# Patient Record
Sex: Female | Born: 1935 | Race: White | Hispanic: No | Smoking: Never smoker
Health system: Southern US, Community
[De-identification: ages and names within clinical notes are randomized; demographics above are authoritative.]

## PROBLEM LIST (undated history)

## (undated) DIAGNOSIS — I10 Essential (primary) hypertension: Secondary | ICD-10-CM

## (undated) DIAGNOSIS — F0390 Unspecified dementia without behavioral disturbance: Secondary | ICD-10-CM

## (undated) HISTORY — PX: ABDOMINAL HYSTERECTOMY: SHX81

## (undated) HISTORY — PX: TONSILLECTOMY: SUR1361

---

## 2019-12-22 ENCOUNTER — Encounter: Payer: Self-pay | Admitting: Emergency Medicine

## 2019-12-22 ENCOUNTER — Other Ambulatory Visit: Payer: Self-pay

## 2019-12-22 ENCOUNTER — Inpatient Hospital Stay
Admission: EM | Admit: 2019-12-22 | Discharge: 2019-12-25 | DRG: 689 | Disposition: A | Discharge status: Hospice | Payer: Medicare Other | Source: Skilled Nursing Facility | Attending: Internal Medicine | Admitting: Internal Medicine

## 2019-12-22 ENCOUNTER — Emergency Department: Payer: Medicare Other

## 2019-12-22 DIAGNOSIS — N179 Acute kidney failure, unspecified: Secondary | ICD-10-CM | POA: Diagnosis not present

## 2019-12-22 DIAGNOSIS — E871 Hypo-osmolality and hyponatremia: Secondary | ICD-10-CM | POA: Diagnosis present

## 2019-12-22 DIAGNOSIS — Z515 Encounter for palliative care: Secondary | ICD-10-CM | POA: Diagnosis not present

## 2019-12-22 DIAGNOSIS — E876 Hypokalemia: Secondary | ICD-10-CM | POA: Diagnosis present

## 2019-12-22 DIAGNOSIS — G9341 Metabolic encephalopathy: Secondary | ICD-10-CM | POA: Diagnosis present

## 2019-12-22 DIAGNOSIS — R778 Other specified abnormalities of plasma proteins: Secondary | ICD-10-CM

## 2019-12-22 DIAGNOSIS — F32A Depression, unspecified: Secondary | ICD-10-CM | POA: Diagnosis present

## 2019-12-22 DIAGNOSIS — F0390 Unspecified dementia without behavioral disturbance: Secondary | ICD-10-CM | POA: Diagnosis present

## 2019-12-22 DIAGNOSIS — B9620 Unspecified Escherichia coli [E. coli] as the cause of diseases classified elsewhere: Secondary | ICD-10-CM | POA: Diagnosis present

## 2019-12-22 DIAGNOSIS — Z20822 Contact with and (suspected) exposure to covid-19: Secondary | ICD-10-CM | POA: Diagnosis present

## 2019-12-22 DIAGNOSIS — N1831 Chronic kidney disease, stage 3a: Secondary | ICD-10-CM

## 2019-12-22 DIAGNOSIS — N3000 Acute cystitis without hematuria: Secondary | ICD-10-CM | POA: Diagnosis not present

## 2019-12-22 DIAGNOSIS — R296 Repeated falls: Secondary | ICD-10-CM | POA: Diagnosis present

## 2019-12-22 DIAGNOSIS — R4182 Altered mental status, unspecified: Secondary | ICD-10-CM

## 2019-12-22 DIAGNOSIS — E039 Hypothyroidism, unspecified: Secondary | ICD-10-CM | POA: Diagnosis present

## 2019-12-22 DIAGNOSIS — Z66 Do not resuscitate: Secondary | ICD-10-CM | POA: Diagnosis present

## 2019-12-22 DIAGNOSIS — F419 Anxiety disorder, unspecified: Secondary | ICD-10-CM | POA: Diagnosis present

## 2019-12-22 DIAGNOSIS — I10 Essential (primary) hypertension: Secondary | ICD-10-CM | POA: Diagnosis present

## 2019-12-22 DIAGNOSIS — N3001 Acute cystitis with hematuria: Principal | ICD-10-CM | POA: Diagnosis present

## 2019-12-22 DIAGNOSIS — R627 Adult failure to thrive: Secondary | ICD-10-CM

## 2019-12-22 DIAGNOSIS — Z7189 Other specified counseling: Secondary | ICD-10-CM

## 2019-12-22 DIAGNOSIS — Z993 Dependence on wheelchair: Secondary | ICD-10-CM

## 2019-12-22 DIAGNOSIS — N390 Urinary tract infection, site not specified: Secondary | ICD-10-CM

## 2019-12-22 DIAGNOSIS — Z90710 Acquired absence of both cervix and uterus: Secondary | ICD-10-CM

## 2019-12-22 DIAGNOSIS — F329 Major depressive disorder, single episode, unspecified: Secondary | ICD-10-CM | POA: Diagnosis present

## 2019-12-22 DIAGNOSIS — I248 Other forms of acute ischemic heart disease: Secondary | ICD-10-CM | POA: Diagnosis present

## 2019-12-22 DIAGNOSIS — I129 Hypertensive chronic kidney disease with stage 1 through stage 4 chronic kidney disease, or unspecified chronic kidney disease: Secondary | ICD-10-CM | POA: Diagnosis present

## 2019-12-22 DIAGNOSIS — E860 Dehydration: Secondary | ICD-10-CM | POA: Diagnosis present

## 2019-12-22 HISTORY — DX: Essential (primary) hypertension: I10

## 2019-12-22 HISTORY — DX: Unspecified dementia, unspecified severity, without behavioral disturbance, psychotic disturbance, mood disturbance, and anxiety: F03.90

## 2019-12-22 LAB — CBC WITH DIFFERENTIAL/PLATELET
Abs Immature Granulocytes: 0.08 10*3/uL — ABNORMAL HIGH (ref 0.00–0.07)
Basophils Absolute: 0.1 10*3/uL (ref 0.0–0.1)
Basophils Relative: 1 %
Eosinophils Absolute: 0 10*3/uL (ref 0.0–0.5)
Eosinophils Relative: 0 %
HCT: 31.6 % — ABNORMAL LOW (ref 36.0–46.0)
Hemoglobin: 11 g/dL — ABNORMAL LOW (ref 12.0–15.0)
Immature Granulocytes: 1 %
Lymphocytes Relative: 12 %
Lymphs Abs: 1.4 10*3/uL (ref 0.7–4.0)
MCH: 31.3 pg (ref 26.0–34.0)
MCHC: 34.8 g/dL (ref 30.0–36.0)
MCV: 89.8 fL (ref 80.0–100.0)
Monocytes Absolute: 1.9 10*3/uL — ABNORMAL HIGH (ref 0.1–1.0)
Monocytes Relative: 17 %
Neutro Abs: 8.2 10*3/uL — ABNORMAL HIGH (ref 1.7–7.7)
Neutrophils Relative %: 69 %
Platelets: 253 10*3/uL (ref 150–400)
RBC: 3.52 MIL/uL — ABNORMAL LOW (ref 3.87–5.11)
RDW: 14.2 % (ref 11.5–15.5)
WBC: 11.7 10*3/uL — ABNORMAL HIGH (ref 4.0–10.5)
nRBC: 0 % (ref 0.0–0.2)

## 2019-12-22 LAB — URINALYSIS, COMPLETE (UACMP) WITH MICROSCOPIC
Bacteria, UA: NONE SEEN
Glucose, UA: NEGATIVE mg/dL
Ketones, ur: 15 mg/dL — AB
Nitrite: NEGATIVE
Protein, ur: >300 mg/dL — AB
RBC / HPF: >50 RBC/hpf (ref 0–5)
Specific Gravity, Urine: 1.02 (ref 1.005–1.030)
Squamous Epithelial / HPF: >50 (ref 0–5)
WBC, UA: >50 WBC/hpf (ref 0–5)
pH: 5 (ref 5.0–8.0)

## 2019-12-22 LAB — COMPREHENSIVE METABOLIC PANEL
ALT: 19 U/L (ref 0–44)
AST: 33 U/L (ref 15–41)
Albumin: 2.6 g/dL — ABNORMAL LOW (ref 3.5–5.0)
Alkaline Phosphatase: 56 U/L (ref 38–126)
Anion gap: 14 (ref 5–15)
BUN: 61 mg/dL — ABNORMAL HIGH (ref 8–23)
CO2: 25 mmol/L (ref 22–32)
Calcium: 9.2 mg/dL (ref 8.9–10.3)
Chloride: 91 mmol/L — ABNORMAL LOW (ref 98–111)
Creatinine, Ser: 1.73 mg/dL — ABNORMAL HIGH (ref 0.44–1.00)
GFR calc Af Amer: 29 mL/min — ABNORMAL LOW (ref >60)
GFR calc non Af Amer: 25 mL/min — ABNORMAL LOW (ref >60)
Glucose, Bld: 139 mg/dL — ABNORMAL HIGH (ref 70–99)
Potassium: 3.3 mmol/L — ABNORMAL LOW (ref 3.5–5.1)
Sodium: 130 mmol/L — ABNORMAL LOW (ref 135–145)
Total Bilirubin: 0.6 mg/dL (ref 0.3–1.2)
Total Protein: 6.7 g/dL (ref 6.5–8.1)

## 2019-12-22 LAB — TROPONIN I (HIGH SENSITIVITY): Troponin I (High Sensitivity): 55 ng/L — ABNORMAL HIGH (ref <18)

## 2019-12-22 LAB — VALPROIC ACID LEVEL: Valproic Acid Lvl: 23 ug/mL — ABNORMAL LOW (ref 50.0–100.0)

## 2019-12-22 MED ORDER — DORZOLAMIDE HCL-TIMOLOL MAL 2-0.5 % OP SOLN
1.0000 [drp] | Freq: Two times a day (BID) | OPHTHALMIC | Status: DC
  Administered 2019-12-22 – 2019-12-24 (×5): 1 [drp] via OPHTHALMIC
  Filled 2019-12-22: qty 10

## 2019-12-22 MED ORDER — SODIUM CHLORIDE 0.9 % IV SOLN: INTRAVENOUS | Status: DC

## 2019-12-22 MED ORDER — FLUOXETINE HCL 20 MG PO CAPS
40.0000 mg | ORAL_CAPSULE | Freq: Every day | ORAL | Status: DC
  Administered 2019-12-23 – 2019-12-24 (×2): 40 mg via ORAL
  Filled 2019-12-22 (×4): qty 2

## 2019-12-22 MED ORDER — MIRTAZAPINE 15 MG PO TABS
15.0000 mg | ORAL_TABLET | Freq: Every day | ORAL | Status: DC
  Administered 2019-12-22 – 2019-12-24 (×3): 15 mg via ORAL
  Filled 2019-12-22 (×3): qty 1

## 2019-12-22 MED ORDER — ONDANSETRON HCL 4 MG/2ML IJ SOLN: 4.0000 mg | Freq: Three times a day (TID) | INTRAMUSCULAR | Status: DC | PRN

## 2019-12-22 MED ORDER — ADULT MULTIVITAMIN W/MINERALS CH
1.0000 | ORAL_TABLET | Freq: Every day | ORAL | Status: DC
  Administered 2019-12-22 – 2019-12-24 (×3): 1 via ORAL
  Filled 2019-12-22 (×3): qty 1

## 2019-12-22 MED ORDER — VITAMIN D3 25 MCG (1000 UNIT) PO TABS
1000.0000 [IU] | ORAL_TABLET | Freq: Every day | ORAL | Status: DC
  Administered 2019-12-22 – 2019-12-24 (×3): 1000 [IU] via ORAL
  Filled 2019-12-22 (×7): qty 1

## 2019-12-22 MED ORDER — HYDRALAZINE HCL 20 MG/ML IJ SOLN
5.0000 mg | INTRAMUSCULAR | Status: DC | PRN
  Administered 2019-12-25: 5 mg via INTRAVENOUS
  Filled 2019-12-22: qty 1

## 2019-12-22 MED ORDER — MEMANTINE HCL 5 MG PO TABS
10.0000 mg | ORAL_TABLET | Freq: Two times a day (BID) | ORAL | Status: DC
  Administered 2019-12-22 – 2019-12-24 (×5): 10 mg via ORAL
  Filled 2019-12-22 (×5): qty 2

## 2019-12-22 MED ORDER — LEVOTHYROXINE SODIUM 50 MCG PO TABS
75.0000 ug | ORAL_TABLET | ORAL | Status: DC
  Administered 2019-12-24 – 2019-12-25 (×2): 75 ug via ORAL
  Filled 2019-12-22 (×2): qty 1

## 2019-12-22 MED ORDER — SODIUM CHLORIDE 0.9 % IV SOLN
1.0000 g | INTRAVENOUS | Status: DC
  Administered 2019-12-23 – 2019-12-25 (×3): 1 g via INTRAVENOUS
  Filled 2019-12-22 (×2): qty 1
  Filled 2019-12-22: qty 10

## 2019-12-22 MED ORDER — DIVALPROEX SODIUM 125 MG PO DR TAB
125.0000 mg | DELAYED_RELEASE_TABLET | Freq: Two times a day (BID) | ORAL | Status: DC
  Administered 2019-12-22 – 2019-12-24 (×5): 125 mg via ORAL
  Filled 2019-12-22 (×7): qty 1

## 2019-12-22 MED ORDER — BACITRACIN-NEOMYCIN-POLYMYXIN 400-5-5000 EX OINT
1.0000 "application " | TOPICAL_OINTMENT | Freq: Every day | CUTANEOUS | Status: DC
  Administered 2019-12-23 – 2019-12-24 (×2): 1 via TOPICAL
  Filled 2019-12-22 (×4): qty 1

## 2019-12-22 MED ORDER — CALCIUM CARBONATE ANTACID 500 MG PO CHEW
600.0000 mg | CHEWABLE_TABLET | Freq: Every day | ORAL | Status: DC
  Administered 2019-12-23 – 2019-12-24 (×2): 600 mg via ORAL
  Filled 2019-12-22 (×2): qty 3

## 2019-12-22 MED ORDER — ACETAMINOPHEN 325 MG PO TABS: 650.0000 mg | ORAL_TABLET | Freq: Four times a day (QID) | ORAL | Status: DC | PRN

## 2019-12-22 MED ORDER — LEVOTHYROXINE SODIUM 25 MCG PO TABS
37.5000 ug | ORAL_TABLET | ORAL | Status: DC
  Administered 2019-12-23: 37.5 ug via ORAL
  Filled 2019-12-22: qty 2

## 2019-12-22 MED ORDER — LACTATED RINGERS IV BOLUS
1000.0000 mL | Freq: Once | INTRAVENOUS | Status: AC
  Administered 2019-12-22: 1000 mL via INTRAVENOUS

## 2019-12-22 MED ORDER — LEVOTHYROXINE SODIUM 25 MCG PO TABS: 37.5000 ug | ORAL_TABLET | ORAL | Status: DC

## 2019-12-22 MED ORDER — ENOXAPARIN SODIUM 40 MG/0.4ML ~~LOC~~ SOLN: 40.0000 mg | SUBCUTANEOUS | Status: DC

## 2019-12-22 MED ORDER — POTASSIUM CHLORIDE CRYS ER 20 MEQ PO TBCR
40.0000 meq | EXTENDED_RELEASE_TABLET | Freq: Once | ORAL | Status: AC
  Administered 2019-12-22: 40 meq via ORAL
  Filled 2019-12-22: qty 4

## 2019-12-22 MED ORDER — LATANOPROST 0.005 % OP SOLN
1.0000 [drp] | Freq: Every day | OPHTHALMIC | Status: DC
  Administered 2019-12-22 – 2019-12-24 (×3): 1 [drp] via OPHTHALMIC
  Filled 2019-12-22: qty 2.5

## 2019-12-22 MED ORDER — SODIUM CHLORIDE 0.9 % IV SOLN
1.0000 g | Freq: Once | INTRAVENOUS | Status: AC
  Administered 2019-12-22: 1 g via INTRAVENOUS
  Filled 2019-12-22: qty 10

## 2019-12-22 MED ORDER — AMLODIPINE BESYLATE 5 MG PO TABS
5.0000 mg | ORAL_TABLET | Freq: Every day | ORAL | Status: DC
  Filled 2019-12-22: qty 1

## 2019-12-22 MED ORDER — ENOXAPARIN SODIUM 30 MG/0.3ML ~~LOC~~ SOLN
30.0000 mg | SUBCUTANEOUS | Status: DC
  Administered 2019-12-22 – 2019-12-23 (×2): 30 mg via SUBCUTANEOUS
  Filled 2019-12-22 (×3): qty 0.3

## 2019-12-22 NOTE — ED Triage Notes (Addendum)
Pt via EMS from [facility] [place]. Facility states pt has been more altered that normal. Pt does have a hx of dementia but pt is usually alert and ambulatory. Facility also stated they noticed a foul-smelling order. On arrival, pt is alert but only stating the words "why". Unable to tell me name, place, time, or situation.

## 2019-12-22 NOTE — ED Provider Notes (Signed)
[facility] Emergency Department Provider Note   ____________________________________________   First MD Initiated Contact with Patient 12/22/19 [phone]     (approximate)  I have reviewed the triage vital signs and the nursing notes.   HISTORY  Chief Complaint Altered Mental Status   HPI Tina Campbell is a 86 y.o. female with past medical history of dementia and hypertension who presents to the ED for altered mental status.  History is limited due to patient's current confusion.  Per EMS, she resides at a nursing facility where she has been increasingly confused with poor p.o. intake over the past couple of days.  She has reportedly not wanted to eat or drink anything and staff felt like her urine seems to have a stronger odor recently.  She has not had any fevers has not complained of any pain.  During transport with EMS, patient would only say "why" repeatedly.  At her baseline, patient is reportedly ambulatory and interactive.        Past Medical History:  Diagnosis Date  . Dementia ([place])   . Hypertension     Patient Active Problem List   Diagnosis Date Noted  . Hypertension   . UTI (urinary tract infection)   . Acute metabolic encephalopathy   . Hypokalemia     History reviewed. No pertinent surgical history.  Prior to Admission medications   Not on File    Allergies Aricept [donepezil], Bee pollen, Chocolate, Fire ant, Peanut butter flavor, and Penicillins  History reviewed. No pertinent family history.  Social History Social History   Tobacco Use  . Smoking status: Not on file  Substance Use Topics  . Alcohol use: Not on file  . Drug use: Not on file    Review of Systems Unable to obtain secondary to dementia and altered mental status.  ____________________________________________   PHYSICAL EXAM:  VITAL SIGNS: ED Triage Vitals  Enc Vitals Group     BP      Pulse      Resp      Temp      Temp src      SpO2       Weight      Height      Head Circumference      Peak Flow      Pain Score      Pain Loc      Pain Edu?      Excl. in [place]?     Constitutional: Awake and alert, states only "why". Eyes: Conjunctivae are normal.  Pupils equal round and reactive to light bilaterally. Head: Atraumatic. Nose: No congestion/rhinnorhea. Mouth/Throat: Mucous membranes are dry. Neck: Normal ROM Cardiovascular: Normal rate, regular rhythm. Grossly normal heart sounds. Respiratory: Normal respiratory effort.  No retractions. Lungs CTAB. Gastrointestinal: Soft and nontender. No distention. Genitourinary: deferred Musculoskeletal: No lower extremity tenderness nor edema. Neurologic:  Normal speech and language. No gross focal neurologic deficits are appreciated. Skin:  Skin is warm, dry and intact. No rash noted. Psychiatric: Unable to assess.  ____________________________________________   LABS (all labs ordered are listed, but only abnormal results are displayed)  Labs Reviewed  CBC WITH DIFFERENTIAL/PLATELET - Abnormal; Notable for the following components:      Result Value   WBC 11.7 (*)    RBC 3.52 (*)    Hemoglobin 11.0 (*)    HCT 31.6 (*)    Neutro Abs 8.2 (*)    Monocytes Absolute 1.9 (*)    Abs Immature Granulocytes  0.08 (*)    All other components within normal limits  COMPREHENSIVE METABOLIC PANEL - Abnormal; Notable for the following components:   Sodium 130 (*)    Potassium 3.3 (*)    Chloride 91 (*)    Glucose, Bld 139 (*)    BUN 61 (*)    Creatinine, Ser 1.73 (*)    Albumin 2.6 (*)    GFR calc non Af Amer 25 (*)    GFR calc Af Amer 29 (*)    All other components within normal limits  URINALYSIS, COMPLETE (UACMP) WITH MICROSCOPIC - Abnormal; Notable for the following components:   APPearance TURBID (*)    Hgb urine dipstick LARGE (*)    Bilirubin Urine MODERATE (*)    Ketones, ur 15 (*)    Protein, ur >300 (*)    Leukocytes,Ua LARGE (*)    All other components within normal  limits  VALPROIC ACID LEVEL - Abnormal; Notable for the following components:   Valproic Acid Lvl 23 (*)    All other components within normal limits  URINE CULTURE  SARS CORONAVIRUS 2 (TAT 6-24 HRS)  CULTURE, BLOOD (ROUTINE X 2)  CULTURE, BLOOD (ROUTINE X 2)   ____________________________________________  EKG  ED ECG REPORT I, Chesley Noon, the attending physician, personally viewed and interpreted this ECG.   Date: 12/22/2019  EKG Time: 9:26  Rate: 68  Rhythm: normal sinus rhythm  Axis: RAD  Intervals:none  ST&T Change: None   PROCEDURES  Procedure(s) performed (including Critical Care):  Procedures   ____________________________________________   INITIAL IMPRESSION / ASSESSMENT AND PLAN / ED COURSE       86 year old female with history of dementia and hypertension presents to the ED from her nursing facility for increased confusion as well as poor p.o. intake over the past couple of days, also reported increased odor of urine.  She is awake and alert on arrival with no apparent focal neurologic deficits, but repeatedly states "why" and is unable to verbalize any other complaints.  She does not appear to have any tenderness on abdominal exam.  We will further assess with CT head, labs, UA.  She does appear dehydrated and we will hydrate with IV fluids.  Also check Depakote level as she takes this regularly per nursing home paperwork.  CT head is negative for acute process.  Lab work is consistent with an AKI and UA also concerning for UTI, urine sample obtained by nursing is grossly purulent.  Depakote levels within normal limits and unlikely to be contributing to her altered mental status.  We will send urine for culture and start dose of Rocephin as she seems to have tolerated cephalosporins in the past.  Case was discussed with hospitalist for admission.      ____________________________________________   FINAL CLINICAL IMPRESSION(S) / ED DIAGNOSES  Final  diagnoses:  Altered mental status, unspecified altered mental status type  Urinary tract infection without hematuria, site unspecified     ED Discharge Orders    None       Note:  This document was prepared using Dragon voice recognition software and may include unintentional dictation errors.   Chesley Noon, MD 12/22/19 1209

## 2019-12-22 NOTE — Progress Notes (Signed)
Anticoagulation monitoring(Lovenox):  86yo  female ordered Lovenox 40 mg Q24h for DVT prevention  Filed Weights   12/22/19 0924  Weight: 58.9 kg (129 lb 14.4 oz)   BMI 22   Lab Results  Component Value Date   CREATININE 1.73 (H) 12/22/2019   Estimated Creatinine Clearance: 17.2 mL/min (A) (by C-G formula based on SCr of 1.73 mg/dL (H)). Hemoglobin & Hematocrit     Component Value Date/Time   HGB 11.0 (L) 12/22/2019 0927   HCT 31.6 (L) 12/22/2019 5300     Per Protocol for Patient with estCrcl < 30 ml/min and BMI < 40, will transition to Lovenox 30 mg Q24h.     Clovia Cuff, PharmD, BCPS 12/22/2019 12:32 PM

## 2019-12-22 NOTE — Plan of Care (Signed)
Patient will improve within 30 days

## 2019-12-22 NOTE — H&P (Signed)
History and Physical    Tina Campbell DGU:[id number] DOB: 12-19-1922 DOA: 12/22/2019  Referring MD/NP/PA:   PCP: [facility]   Patient coming from:  The patient is coming from SNF.  At baseline, pt is dependent for most of ADL.        Chief Complaint: Altered mental status  HPI: Tina Campbell is a 86 y.o. female with medical history significant of hypertension, dementia, CKD stage III, depression, hypothyroidism, who presents with altered mental status.  Per her daughter (I called her daughter by phone), patient normally knows the place and sometimes oriented to time, and sometimes can recognize family members, but per report, patient has been more confused in the past several days. Pt stopped talking. She has poor appetite and poor oral intake.  She has strong urine smell.  No active cough, respiratory distress, nausea, vomiting, diarrhea noted.  Patient does not seem to have chest pain or abdominal pain.  She moves all extremities.  ED Course: pt was found to have WBC 11.7, pending COVID-19 PCR, valproic acid level 23, urinalysis (turbid appearance, large amount of leukocyte, no bacteria, WBC > 50, squamous cell 50), potassium 3.3, worsening renal function, temperature normal, blood pressure 115/49, heart rate 64, RR 20, oxygen saturation 92% on room air.  CT head is negative for acute intracranial abnormalities.  Patient is placed on MedSurg bed for observation.  Review of Systems: Could not be reviewed due to dementia and altered mental status.    Allergy:  Allergies  Allergen Reactions  . Aricept [Donepezil]   . Bee Pollen   . Chocolate   . Fire [employer]   . Peanut Butter Flavor   . Penicillins     Past Medical History:  Diagnosis Date  . Dementia ([facility])   . Hypertension     Past Surgical History:  Procedure Laterality Date  . ABDOMINAL HYSTERECTOMY    . TONSILLECTOMY      Social History:  reports that she has never smoked. She has never used  smokeless tobacco. She reports previous alcohol use. She reports that she does not use drugs.  Family History: could not be reviewed due to altered mental status and dementia  Prior to Admission medications   Not on File    Physical Exam: Vitals:   12/22/19 1059 12/22/19 1100 12/22/19 1130 12/22/19 1200  BP: (!) 106/50 (!) 115/49 (!) 130/47 (!) 117/50  Pulse: 63 64 70 68  Resp: 13 13 13 13   Temp:      TempSrc:      SpO2: 99% 99% 91% 98%  Weight:      Height:       General: Not in acute distress HEENT:       Eyes: PERRL, EOMI, no scleral icterus.       ENT: No discharge from the ears and nose.       Neck: No JVD, no bruit, no mass felt. Heme: No neck lymph node enlargement. Cardiac: S1/S2, RRR, No murmurs, No gallops or rubs. Respiratory: No rales, wheezing, rhonchi or rubs. GI: Soft, nondistended, nontender, no organomegaly, BS present. GU: No hematuria Ext: No pitting leg edema bilaterally. 1+DP/PT pulse bilaterally. Musculoskeletal: No joint deformities, No joint redness or warmth, no limitation of ROM in spin. Skin: No rashes.  Neuro: pt is not answering questions, not following command, not oriented X3, cranial nerves II-XII grossly intact, moves all extremities.  Psych: Patient is not psychotic, no suicidal or hemocidal ideation.  Labs on Admission: I have personally  reviewed following labs and imaging studies  CBC: Recent Labs  Lab 12/22/19 0927  WBC 11.7*  NEUTROABS 8.2*  HGB 11.0*  HCT 31.6*  MCV 89.8  PLT 161   Basic Metabolic Panel: Recent Labs  Lab 12/22/19 0927  NA 130*  K 3.3*  CL 91*  CO2 25  GLUCOSE 139*  BUN 61*  CREATININE 1.73*  CALCIUM 9.2   GFR: Estimated Creatinine Clearance: 17.2 mL/min (A) (by C-G formula based on SCr of 1.73 mg/dL (H)). Liver Function Tests: Recent Labs  Lab 12/22/19 0927  AST 33  ALT 19  ALKPHOS 56  BILITOT 0.6  PROT 6.7  ALBUMIN 2.6*   No results for input(s): LIPASE, AMYLASE in the last 168  hours. No results for input(s): AMMONIA in the last 168 hours. Coagulation Profile: No results for input(s): INR, PROTIME in the last 168 hours. Cardiac Enzymes: No results for input(s): CKTOTAL, CKMB, CKMBINDEX, TROPONINI in the last 168 hours. BNP (last 3 results) No results for input(s): PROBNP in the last 8760 hours. HbA1C: No results for input(s): HGBA1C in the last 72 hours. CBG: No results for input(s): GLUCAP in the last 168 hours. Lipid Profile: No results for input(s): CHOL, HDL, LDLCALC, TRIG, CHOLHDL, LDLDIRECT in the last 72 hours. Thyroid Function Tests: No results for input(s): TSH, T4TOTAL, FREET4, T3FREE, THYROIDAB in the last 72 hours. Anemia Panel: No results for input(s): VITAMINB12, FOLATE, FERRITIN, TIBC, IRON, RETICCTPCT in the last 72 hours. Urine analysis:    Component Value Date/Time   COLORURINE YELLOW 12/22/2019 1108   APPEARANCEUR TURBID (A) 12/22/2019 1108   LABSPEC 1.020 12/22/2019 1108   PHURINE 5.0 12/22/2019 1108   GLUCOSEU NEGATIVE 12/22/2019 1108   HGBUR LARGE (A) 12/22/2019 1108   BILIRUBINUR MODERATE (A) 12/22/2019 1108   KETONESUR 15 (A) 12/22/2019 1108   PROTEINUR >300 (A) 12/22/2019 1108   NITRITE NEGATIVE 12/22/2019 1108   LEUKOCYTESUR LARGE (A) 12/22/2019 1108   Sepsis Labs: @LABRCNTIP (procalcitonin:4,lacticidven:4) )No results found for this or any previous visit (from the past 240 hour(s)).   Radiological Exams on Admission: CT Head Wo Contrast  Result Date: 12/22/2019 CLINICAL DATA:  Altered mental status. EXAM: CT HEAD WITHOUT CONTRAST TECHNIQUE: Contiguous axial images were obtained from the base of the skull through the vertex without intravenous contrast. COMPARISON:  None. FINDINGS: Brain: No acute intracranial hemorrhage. No focal mass lesion. No CT evidence of acute infarction. No midline shift or mass effect. No hydrocephalus. Basilar cisterns are patent. There are periventricular and subcortical white matter hypodensities.  Generalized cortical atrophy. Vascular: No hyperdense vessel or unexpected calcification. Skull: Normal. Negative for fracture or focal lesion. Sinuses/Orbits: Paranasal sinuses and mastoid air cells are clear. Orbits are clear. Other: None. IMPRESSION: 1. No acute intracranial findings. 2. Atrophy and white matter microvascular disease. Electronically Signed   By: Suzy Bouchard M.D.   On: 12/22/2019 11:03     EKG: Independently reviewed.  Sinus rhythm, QTC 445, T wave inversion in lead III/aV F, anteroseptal infarction pattern  Assessment/Plan Principal Problem:   UTI (urinary tract infection) Active Problems:   Hypertension   Acute metabolic encephalopathy   Hypokalemia   Acute renal failure superimposed on stage 3a chronic kidney disease (HCC)   Dementia (HCC)   Hypothyroidism   Depression   Possible UTI (urinary tract infection):  urinalysis showed turbid appearance, large amount of leukocyte, no bacteria, WBC > 50, but with squamous cell 50.   -will place on MedSurg bed for position -Rocephin IV -Follow-up blood  culture and urine culture  HTN:  -Continue home medications: Amlodipine -hold Avalide due to worsening renal function -hydralazine prn  Acute metabolic encephalopathy: Likely due to UTI. CT-head negative.  No focal neurological deficit on physical examination -Frequent neuro check  Hypokalemia: K= 3.3 on admission. - Repleted - Check Mg level  Acute renal failure superimposed on stage 3a chronic kidney disease (HCC):  Baseline Cre is 1.0 on 05/23/2019, pt's Cre is 1.73, BUN 61 on admission. Likely due to prerenal secondary to dehydration and continuation of ARB, diuretics - IVF: 1L LR and then 75 cc/h - Follow up renal function by BMP - Avoid using renal toxic medications, hypotension and contrast dye (or carefully use) - Hold Avalide  Dementia (HCC): -Namenda  Hypothyroidism -Synthroid  Depression and anxiety -Depakote, Prozac, Remeron     DVT  ppx: SQ Lovenox Code Status: DNR per her daughter Family Communication:  Yes, patient's daughter by phone Disposition Plan:  Anticipate discharge back to previous SNF environment Consults called:  none Admission status: Med-surg bed for obs      Status is: Observation  The patient remains OBS appropriate and will d/c before 2 midnights.  Dispo: The patient is from: SNF              Anticipated d/c is to: SNF              Anticipated d/c date is: 1 day              Patient currently is not medically stable to d/c.          Date of Service 12/22/2019    Lorretta Harp [facility]   If 7PM-7AM, please contact night-coverage [email or web address] 12/22/2019, 12:55 PM

## 2019-12-23 DIAGNOSIS — Z515 Encounter for palliative care: Secondary | ICD-10-CM | POA: Diagnosis not present

## 2019-12-23 DIAGNOSIS — G9341 Metabolic encephalopathy: Secondary | ICD-10-CM | POA: Diagnosis present

## 2019-12-23 DIAGNOSIS — N3000 Acute cystitis without hematuria: Secondary | ICD-10-CM | POA: Diagnosis not present

## 2019-12-23 DIAGNOSIS — Z66 Do not resuscitate: Secondary | ICD-10-CM | POA: Diagnosis present

## 2019-12-23 DIAGNOSIS — I248 Other forms of acute ischemic heart disease: Secondary | ICD-10-CM | POA: Diagnosis present

## 2019-12-23 DIAGNOSIS — F0390 Unspecified dementia without behavioral disturbance: Secondary | ICD-10-CM | POA: Diagnosis present

## 2019-12-23 DIAGNOSIS — Z993 Dependence on wheelchair: Secondary | ICD-10-CM | POA: Diagnosis not present

## 2019-12-23 DIAGNOSIS — F0391 Unspecified dementia with behavioral disturbance: Secondary | ICD-10-CM

## 2019-12-23 DIAGNOSIS — F419 Anxiety disorder, unspecified: Secondary | ICD-10-CM | POA: Diagnosis present

## 2019-12-23 DIAGNOSIS — Z7189 Other specified counseling: Secondary | ICD-10-CM | POA: Diagnosis not present

## 2019-12-23 DIAGNOSIS — E876 Hypokalemia: Secondary | ICD-10-CM | POA: Diagnosis present

## 2019-12-23 DIAGNOSIS — B9620 Unspecified Escherichia coli [E. coli] as the cause of diseases classified elsewhere: Secondary | ICD-10-CM | POA: Diagnosis present

## 2019-12-23 DIAGNOSIS — E871 Hypo-osmolality and hyponatremia: Secondary | ICD-10-CM | POA: Diagnosis present

## 2019-12-23 DIAGNOSIS — N3001 Acute cystitis with hematuria: Secondary | ICD-10-CM | POA: Diagnosis present

## 2019-12-23 DIAGNOSIS — R4182 Altered mental status, unspecified: Secondary | ICD-10-CM | POA: Diagnosis present

## 2019-12-23 DIAGNOSIS — Z20822 Contact with and (suspected) exposure to covid-19: Secondary | ICD-10-CM | POA: Diagnosis present

## 2019-12-23 DIAGNOSIS — R627 Adult failure to thrive: Secondary | ICD-10-CM | POA: Diagnosis present

## 2019-12-23 DIAGNOSIS — E039 Hypothyroidism, unspecified: Secondary | ICD-10-CM | POA: Diagnosis present

## 2019-12-23 DIAGNOSIS — Z90710 Acquired absence of both cervix and uterus: Secondary | ICD-10-CM | POA: Diagnosis not present

## 2019-12-23 DIAGNOSIS — R296 Repeated falls: Secondary | ICD-10-CM | POA: Diagnosis present

## 2019-12-23 DIAGNOSIS — F329 Major depressive disorder, single episode, unspecified: Secondary | ICD-10-CM | POA: Diagnosis present

## 2019-12-23 DIAGNOSIS — E860 Dehydration: Secondary | ICD-10-CM | POA: Diagnosis present

## 2019-12-23 DIAGNOSIS — N1831 Chronic kidney disease, stage 3a: Secondary | ICD-10-CM | POA: Diagnosis present

## 2019-12-23 DIAGNOSIS — N179 Acute kidney failure, unspecified: Secondary | ICD-10-CM | POA: Diagnosis present

## 2019-12-23 DIAGNOSIS — I129 Hypertensive chronic kidney disease with stage 1 through stage 4 chronic kidney disease, or unspecified chronic kidney disease: Secondary | ICD-10-CM | POA: Diagnosis present

## 2019-12-23 LAB — CBC
HCT: 30.7 % — ABNORMAL LOW (ref 36.0–46.0)
Hemoglobin: 10.9 g/dL — ABNORMAL LOW (ref 12.0–15.0)
MCH: 31.4 pg (ref 26.0–34.0)
MCHC: 35.5 g/dL (ref 30.0–36.0)
MCV: 88.5 fL (ref 80.0–100.0)
Platelets: 261 10*3/uL (ref 150–400)
RBC: 3.47 MIL/uL — ABNORMAL LOW (ref 3.87–5.11)
RDW: 14.1 % (ref 11.5–15.5)
WBC: 13.8 10*3/uL — ABNORMAL HIGH (ref 4.0–10.5)
nRBC: 0 % (ref 0.0–0.2)

## 2019-12-23 LAB — BASIC METABOLIC PANEL
Anion gap: 10 (ref 5–15)
BUN: 44 mg/dL — ABNORMAL HIGH (ref 8–23)
CO2: 24 mmol/L (ref 22–32)
Calcium: 8.7 mg/dL — ABNORMAL LOW (ref 8.9–10.3)
Chloride: 100 mmol/L (ref 98–111)
Creatinine, Ser: 1.2 mg/dL — ABNORMAL HIGH (ref 0.44–1.00)
GFR calc Af Amer: 45 mL/min — ABNORMAL LOW (ref >60)
GFR calc non Af Amer: 39 mL/min — ABNORMAL LOW (ref >60)
Glucose, Bld: 122 mg/dL — ABNORMAL HIGH (ref 70–99)
Potassium: 3.7 mmol/L (ref 3.5–5.1)
Sodium: 134 mmol/L — ABNORMAL LOW (ref 135–145)

## 2019-12-23 LAB — SARS CORONAVIRUS 2 (TAT 6-24 HRS): SARS Coronavirus 2: NEGATIVE

## 2019-12-23 LAB — MAGNESIUM: Magnesium: 1.9 mg/dL (ref 1.7–2.4)

## 2019-12-23 NOTE — Progress Notes (Addendum)
Diastolic bp 45  And patient has a scheduled Norvasc. Messaged Dr. Hanley Ben and he said not to give he would discontinue.

## 2019-12-23 NOTE — Progress Notes (Signed)
Patient ID: Tina Campbell, female   DOB: 1922-09-12, 86 y.o.   MRN: [id number]  PROGRESS NOTE    Tina Campbell  QAS:[id number] DOB: [birth year] DOA: 12/22/2019 PCP: [facility]   Brief Narrative:  86 year old female with history of hypertension, dementia,  chronic renal disease stage III, depression, hypothyroidism, overall worsening general health over the last few weeks presented with altered mental status.  On presentation, UA was suggestive of UTI.  CT head was negative for acute intracranial abnormality.  She was started on IV antibiotics.  Assessment & Plan:   Probable UTI: Present on admission -Continue Rocephin.  Follow cultures.  Continue IV fluids  Acute metabolic encephalopathy History of dementia -Patient's overall status has been declining recently. -Mental status is not improving.  Monitor mental status.  Fall precaution.  Delirium precautions. -CT head was negative for acute abnormality -If condition does not improve in the next 24/48 hours, consider hospice/comfort measures.  Palliative care evaluation for goals of care discussion. -Continue Namenda  Leukocytosis -Probably from UTI.  Monitor  Acute kidney injury on chronic renal disease stage IIIa -Presented with creatinine of 1.73.  Baseline creatinine of 1 on 05/23/2019.  Creatinine 1.20 this morning.  Monitor.  Hypokalemia Replaced.  Improved  Hypertension -Monitor blood pressure.  Antihypertensives on hold  Hypothyroidism -Synthroid  Depression and anxiety -Continue Depakote, Prozac and Remeron    DVT prophylaxis: Lovenox Code Status: Full Family Communication: Son and daughter-in-law at bedside Disposition Plan: Status is: Observation  The patient will require care spanning > 2 midnights and should be moved to inpatient because: Altered mental status  Dispo: The patient is from: Group home              Anticipated d/c is to: SNF              Anticipated d/c date is: > 3  days              Patient currently is not medically stable to d/c.  Consultants: Will consult palliative care  Procedures: None  Antimicrobials: Rocephin from 12/22/2019 onwards   Subjective: Patient seen and examined at bedside.  She is sleepy, wakes up slightly, tells her name but otherwise hardly answers any questions.  Poor historian.  No overnight fever or vomiting reported.  Oral intake is poor.  Son and daughter-in-law at bedside. Objective: Vitals:   12/22/19 1942 12/22/19 2340 12/23/19 0446 12/23/19 0906  BP: (!) 134/46 (!) 133/51 (!) 162/50 (!) 147/45  Pulse: 88 75 73 83  Resp: 16 16 16 16   Temp: 98.4 F (36.9 C) 98.5 F (36.9 C) 98.6 F (37 C) 99.4 F (37.4 C)  TempSrc: Oral Oral Oral   SpO2: (!) 86% 93% 90% 95%  Weight:      Height:        Intake/Output Summary (Last 24 hours) at 12/23/2019 1120 Last data filed at 12/23/2019 0458 Gross per 24 hour  Intake 1706.53 ml  Output 600 ml  Net 1106.53 ml   Filed Weights   12/22/19 0924  Weight: 58.9 kg    Examination:  General exam: Appears calm and comfortable.  Elderly female lying in bed. Respiratory system: Bilateral decreased breath sounds at bases with some scattered crackles Cardiovascular system: S1 & S2 heard, Rate controlled Gastrointestinal system: Abdomen is nondistended, soft and nontender. Normal bowel sounds heard. Extremities: No cyanosis, clubbing, edema  Central nervous system: Sleepy, wakes up slightly, earlier participates in any conversation.  Confused.  Poor historian.  No focal  neurological deficits. Moving extremities Skin: No rashes, lesions or ulcers Psychiatry: Could not be assessed because of mental status    Data Reviewed: I have personally reviewed following labs and imaging studies  CBC: Recent Labs  Lab 12/22/19 0927 12/23/19 0522  WBC 11.7* 13.8*  NEUTROABS 8.2*  --   HGB 11.0* 10.9*  HCT 31.6* 30.7*  MCV 89.8 88.5  PLT 253 261   Basic Metabolic Panel: Recent Labs   Lab 12/22/19 0927 12/23/19 0522  NA 130* 134*  K 3.3* 3.7  CL 91* 100  CO2 25 24  GLUCOSE 139* 122*  BUN 61* 44*  CREATININE 1.73* 1.20*  CALCIUM 9.2 8.7*  MG  --  1.9   GFR: Estimated Creatinine Clearance: 24.8 mL/min (A) (by C-G formula based on SCr of 1.2 mg/dL (H)). Liver Function Tests: Recent Labs  Lab 12/22/19 0927  AST 33  ALT 19  ALKPHOS 56  BILITOT 0.6  PROT 6.7  ALBUMIN 2.6*   No results for input(s): LIPASE, AMYLASE in the last 168 hours. No results for input(s): AMMONIA in the last 168 hours. Coagulation Profile: No results for input(s): INR, PROTIME in the last 168 hours. Cardiac Enzymes: No results for input(s): CKTOTAL, CKMB, CKMBINDEX, TROPONINI in the last 168 hours. BNP (last 3 results) No results for input(s): PROBNP in the last 8760 hours. HbA1C: No results for input(s): HGBA1C in the last 72 hours. CBG: No results for input(s): GLUCAP in the last 168 hours. Lipid Profile: No results for input(s): CHOL, HDL, LDLCALC, TRIG, CHOLHDL, LDLDIRECT in the last 72 hours. Thyroid Function Tests: No results for input(s): TSH, T4TOTAL, FREET4, T3FREE, THYROIDAB in the last 72 hours. Anemia Panel: No results for input(s): VITAMINB12, FOLATE, FERRITIN, TIBC, IRON, RETICCTPCT in the last 72 hours. Sepsis Labs: No results for input(s): PROCALCITON, LATICACIDVEN in the last 168 hours.  Recent Results (from the past 240 hour(s))  SARS CORONAVIRUS 2 (TAT 6-24 HRS) Nasopharyngeal Nasopharyngeal Swab     Status: None   Collection Time: 12/22/19 12:03 PM   Specimen: Nasopharyngeal Swab  Result Value Ref Range Status   SARS Coronavirus 2 NEGATIVE NEGATIVE Final    Comment: (NOTE) SARS-CoV-2 target nucleic acids are NOT DETECTED.  The SARS-CoV-2 RNA is generally detectable in upper and lower respiratory specimens during the acute phase of infection. Negative results do not preclude SARS-CoV-2 infection, do not rule out co-infections with other pathogens, and  should not be used as the sole basis for treatment or other patient management decisions. Negative results must be combined with clinical observations, patient history, and epidemiological information. The expected result is Negative.  Fact Sheet for Patients: HairSlick.no  Fact Sheet for Healthcare Providers: quierodirigir.com  This test is not yet approved or cleared by the [place] FDA and  has been authorized for detection and/or diagnosis of SARS-CoV-2 by FDA under an Emergency Use Authorization (EUA). This EUA will remain  in effect (meaning this test can be used) for the duration of the COVID-19 declaration under Se ction 564(b)(1) of the Act, 21 U.S.C. section 360bbb-3(b)(1), unless the authorization is terminated or revoked sooner.  Performed at [facility], [address] [place], [place] [address]   CULTURE, BLOOD (ROUTINE X 2) w Reflex to ID Panel     Status: None (Preliminary result)   Collection Time: 12/22/19 12:32 PM   Specimen: BLOOD  Result Value Ref Range Status   Specimen Description BLOOD LEFT ANTECUBITAL  Final   Special Requests  Final    BOTTLES DRAWN AEROBIC AND ANAEROBIC Blood Culture results may not be optimal due to an inadequate volume of blood received in culture bottles   Culture   Final    NO GROWTH < 24 HOURS Performed at [facility], [address] [place], [place] [address]    Report Status PENDING  Incomplete  CULTURE, BLOOD (ROUTINE X 2) w Reflex to ID Panel     Status: None (Preliminary result)   Collection Time: 12/22/19 12:32 PM   Specimen: BLOOD  Result Value Ref Range Status   Specimen Description BLOOD RIGHT ANTECUBITAL  Final   Special Requests   Final    BOTTLES DRAWN AEROBIC AND ANAEROBIC Blood Culture adequate volume   Culture   Final    NO GROWTH < 24 HOURS Performed at [facility], [address], [place], [place] [address]    Report  Status PENDING  Incomplete         Radiology Studies: CT Head Wo Contrast  Result Date: 12/22/2019 CLINICAL DATA:  Altered mental status. EXAM: CT HEAD WITHOUT CONTRAST TECHNIQUE: Contiguous axial images were obtained from the base of the skull through the vertex without intravenous contrast. COMPARISON:  None. FINDINGS: Brain: No acute intracranial hemorrhage. No focal mass lesion. No CT evidence of acute infarction. No midline shift or mass effect. No hydrocephalus. Basilar cisterns are patent. There are periventricular and subcortical white matter hypodensities. Generalized cortical atrophy. Vascular: No hyperdense vessel or unexpected calcification. Skull: [place]. Negative for fracture or focal lesion. Sinuses/Orbits: Paranasal sinuses and mastoid air cells are clear. Orbits are clear. Other: None. IMPRESSION: 1. No acute intracranial findings. 2. Atrophy and white matter microvascular disease. Electronically Signed   By: Suzy Bouchard M.D.   On: 12/22/2019 11:03        Scheduled Meds: . calcium carbonate  600 mg of elemental calcium Oral Q breakfast  . cholecalciferol  1,000 Units Oral QHS  . divalproex  125 mg Oral BID  . dorzolamide-timolol  1 drop Both Eyes BID  . enoxaparin (LOVENOX) injection  30 mg Subcutaneous Q24H  . FLUoxetine  40 mg Oral Daily  . latanoprost  1 drop Both Eyes QHS  . levothyroxine  37.5 mcg Oral Once per day on Mon Fri  . [START ON 12/24/2019] levothyroxine  75 mcg Oral Once per day on Sun Tue Wed Thu Sat  . memantine  10 mg Oral BID  . mirtazapine  15 mg Oral QHS  . multivitamin with minerals  1 tablet Oral QHS  . neomycin-bacitracin-polymyxin  1 application Topical Daily   Continuous Infusions: . sodium chloride 75 mL/hr at 12/23/19 0400  . cefTRIAXone (ROCEPHIN)  IV            Aline August, MD [facility] 12/23/2019, 11:20 AM

## 2019-12-23 NOTE — Evaluation (Addendum)
Clinical/Bedside Swallow Evaluation Patient Details  Name: Tina Campbell MRN: [id number] Date of Birth: 06/28/1923  Today's Date: 12/23/2019 Time: SLP Start Time (ACUTE ONLY): 54 SLP Stop Time (ACUTE ONLY): 1230 SLP Time Calculation (min) (ACUTE ONLY): 60 min  Past Medical History:  Past Medical History:  Diagnosis Date  . Dementia ([place])   . Hypertension    Past Surgical History:  Past Surgical History:  Procedure Laterality Date  . ABDOMINAL HYSTERECTOMY    . TONSILLECTOMY     HPI:  Pt is a 86 y.o. female with medical history significant of hypertension, advanced Dementia, CKD stage III, depression, hypothyroidism, falls prior to move to SNF per family, who presents with altered mental status. At baseline, pt is dependent for most of ADLs at [facility]. Per family report, she has been declining since the first of the year(Jan. 2021). Per EMS, she resides at a nursing facility where she has been increasingly confused with poor p.o. intake over the past few days. She has reportedly not wanted to eat or drink anything and staff felt like her urine seems to have a stronger odor recently. She has not had any fevers has not complained of any pain. Family stated she likes "sweets". No apparent focal neurological deficits noted at admit. Lab work is consistent with an AKI and UA also concerning for UTI.    Assessment / Plan / Recommendation Clinical Impression  Pt appears to present w/ a grossly functional oropharyngeal phase swallow during po trials at Lunch meal today. Pt has Baseline Dementia, and w/ Cognitive decline, she can be at increased risk for aspiration, dysphagia. During po trials today, min, inconsistent cough noted x2 at beginning of the po trials but this did not increase as trials (of liquids then meal) continued. Pt would benefit from aspiration precautions and Supervision during oral intake, meals.   With trials today, pt required full positioning upright for oral  intake. Pt required verbal/visual/tactile cues for follow through w/ po tasks and self-feeding -- pt only held the Cup to self-drink. Much Support was given throughout d/t Cognitive decline. Pt consumed trials w/ no consistent, overt clinical s/s of aspiration noted; inconsistent, mild cough noted at beginning of thin liquid trials but no increased coughing occurred as trials continued - pt consumed ~8 ozs total during meal. No decline in respiratory effort/status noted during/post trials; vocal quality clear when she spoke. Pt consumed trials of purees and soft solids cut well/moistened w/ no overt clinical s/s of aspiration or significant oral phase deficits noted. Pt cleared orally given Time for mastication. OM exam was brief w/ no unilateral weakness noted.   Recommend a Dysphagia level 3 (mech soft) diet w/ thin liquids w/ general aspiration precautions; Pills in Puree for safer swallowing; support at meals w/ feeding and positioning. Reduce distractions at meals. No further skilled ST services indicated at this time; NSG to reconsult if any new needs arise. Precautions posted in room; Education completed w/ family member present. NSG updated.  SLP Visit Diagnosis: Dysphagia, unspecified (R13.10) (Dementia baseline)    Aspiration Risk  Mild aspiration risk;Risk for inadequate nutrition/hydration (reduced following precautions)    Diet Recommendation  Dysphagia level 3 w/ Minced/Cut meat, gravies; Thin liquids. General aspiration precautions. Feeding support and positioning at all meals. Reduce distractions during meals. May need Feeding d/t Cognitive decline.  Medication Administration: Crushed with puree (as needed for safer swallowing)    Other  Recommendations Recommended Consults:  (Dietician; Palliative following) Oral Care Recommendations: Oral care  BID;Oral care before and after PO;Staff/trained caregiver to provide oral care Other Recommendations:  (n/a)   Follow up Recommendations None       Frequency and Duration  (n/a)   (n/a)       Prognosis Prognosis for Safe Diet Advancement: Fair (-Good) Barriers to Reach Goals: Cognitive deficits;Time post onset;Severity of deficits      Swallow Study   General Date of Onset: 12/22/19 HPI: Pt is a 86 y.o. female with medical history significant of hypertension, advanced Dementia, CKD stage III, depression, hypothyroidism, falls prior to move to SNF per family, who presents with altered mental status. At baseline, pt is dependent for most of ADLs at [facility]. Per family report, she has been declining since the first of the year(Jan. 2021). Per EMS, she resides at a nursing facility where she has been increasingly confused with poor p.o. intake over the past few days. She has reportedly not wanted to eat or drink anything and staff felt like her urine seems to have a stronger odor recently. She has not had any fevers has not complained of any pain. Family stated she likes "sweets". No apparent focal neurological deficits noted at admit. Lab work is consistent with an AKI and UA also concerning for UTI.  Type of Study: Bedside Swallow Evaluation Previous Swallow Assessment: none reported Diet Prior to this Study: Regular;Thin liquids Temperature Spikes Noted: No (wbc 13.8) Respiratory Status: Room air History of Recent Intubation: No Behavior/Cognition: Alert;Cooperative;Pleasant mood;Confused;Requires cueing;Distractible (HOH but appeared adequate during one on one) Oral Cavity Assessment: Dry Oral Care Completed by SLP: Yes Oral Cavity - Dentition: Adequate natural dentition Vision: Functional for self-feeding Self-Feeding Abilities: Able to feed self;Needs assist;Needs set up;Total assist (often did not attempt to) Patient Positioning: Upright in bed (needed full positioning) Baseline Vocal Quality: Normal (w/ few words) Volitional Cough: Cognitively unable to elicit Volitional Swallow: Unable to elicit     Oral/Motor/Sensory Function Overall Oral Motor/Sensory Function: Within functional limits (and w/ po trials and oral clearing)   Ice Chips Ice chips: Within functional limits Presentation: Spoon (fed; 2 trials)   Thin Liquid Thin Liquid: Impaired (mild cough x2) Presentation: Cup;Straw;Self Fed (fully supported by SLP/Dtr; 10+ trials) Oral Phase Impairments:  (none) Oral Phase Functional Implications:  (none) Pharyngeal  Phase Impairments: Cough - Immediate (mild x2) Other Comments: no increased coughing occurred as trials continued - pt consumed ~8 ozs total    Nectar Thick Nectar Thick Liquid: Not tested   Honey Thick Honey Thick Liquid: Not tested   Puree Puree: Within functional limits Presentation: Spoon (fed; 6+ trials)   Solid     Solid: Impaired Presentation: Spoon (fed; 6+ trials) Oral Phase Impairments: Impaired mastication (min) Oral Phase Functional Implications:  (increased time) Pharyngeal Phase Impairments:  (none)       Jerilynn Som, MS, CCC-SLP Tina Campbell 12/23/2019,3:47 PM

## 2019-12-23 NOTE — TOC Initial Note (Signed)
Transition of Care [facility]) - Initial/Assessment Note    Patient Details  Name: Tina Campbell MRN: [id number] Date of Birth: [birth year]  [facility]) CM/SW Contact:    Elease Hashimoto, LCSW Phone Number: 12/23/2019, 12:15 PM  Clinical Narrative: Met with daughter in-law at the bedside to obtain information. Pt was sleeping and has dementia and is very HOH. She reports [facility] due to could not be managed at home anymore. She was able to ambulate short distances-5-10 ft with rw and needed assist with all other needs-bathing and dressing and was incontinent prior to admission. She could speak in one word and could gesture. Daughter is coming into day to meet with palliative care and go over goals of care. Pt has a son and daughter and both are very involved. Will continue to follow and work on best discharge plan for pt.                 Expected Discharge Plan: Memory Care Barriers to Discharge: Continued Medical Work up   Patient Goals and CMS Choice Patient states their goals for this hospitalization and ongoing recovery are:: Daughter in-law in room and provided information. Pt is sleeping and very HOH according to daughter in-law      Expected Discharge Plan and Services Expected Discharge Plan: Memory Care In-house Referral: Clinical Social Work     Living arrangements for the past 2 months: [place]                                      Prior Living Arrangements/Services Living arrangements for the past 2 months: [place] Lives with:: Facility Resident ([facility]) Patient language and need for interpreter reviewed:: No Do you feel safe going back to the place where you live?: Yes      Need for Family Participation in Patient Care: No (Comment) Care giver support system in place?: No (comment) Current home services: DME (has rw) Criminal Activity/Legal Involvement Pertinent to  Current Situation/Hospitalization: No - Comment as needed  Activities of Daily Living Home Assistive Devices/Equipment: None ADL Screening (condition at time of admission) Patient's cognitive ability adequate to safely complete daily activities?: No Is the patient deaf or have difficulty hearing?: No Does the patient have difficulty seeing, even when wearing glasses/contacts?: No Does the patient have difficulty concentrating, remembering, or making decisions?: Yes Patient able to express need for assistance with ADLs?: No Does the patient have difficulty dressing or bathing?: Yes Independently performs ADLs?: No Communication: Dependent Is this a change from baseline?: Change from baseline, expected to last <3 days Dressing (OT): Dependent Is this a change from baseline?: Pre-admission baseline Grooming: Dependent Is this a change from baseline?: Pre-admission baseline Feeding: Dependent Is this a change from baseline?: Pre-admission baseline Bathing: Dependent Is this a change from baseline?: Pre-admission baseline Toileting: Dependent Is this a change from baseline?: Pre-admission baseline In/Out Bed: Dependent Is this a change from baseline?: Pre-admission baseline Walks in Home: Dependent Is this a change from baseline?: Pre-admission baseline Does the patient have difficulty walking or climbing stairs?: Yes Weakness of Legs: Both Weakness of Arms/Hands: Both  Permission Sought/Granted Permission sought to share information with : Facility Sport and exercise psychologist, Family Supports Permission granted to share information with : Yes, Verbal Permission Granted  Share Information with NAME: Jocelyn Lamer  Permission granted to share info w AGENCY: [facility]  Permission granted to share info w Relationship: daughter     Emotional Assessment Appearance:: Appears stated age Attitude/Demeanor/Rapport: Other (comment) (sleeping) Affect (typically observed): Other (comment)  (sleeping) Orientation: : Fluctuating Orientation (Suspected and/or reported Sundowners)   Psych Involvement: No (comment)  Admission diagnosis:  UTI (urinary tract infection) [N39.0] Urinary tract infection without hematuria, site unspecified [N39.0] Altered mental status, unspecified altered mental status type [G67.70] Metabolic encephalopathy [H40.35] Patient Active Problem List   Diagnosis Date Noted  . Metabolic encephalopathy 24/81/8590  . Hypertension   . UTI (urinary tract infection)   . Acute metabolic encephalopathy   . Hypokalemia   . Acute renal failure superimposed on stage 3a chronic kidney disease ([place])   . Dementia ([place])   . Hypothyroidism   . Depression    PCP:  [facility] Pharmacy:   [facility], [address] [place] [address] Phone: [phone] Fax: [phone]     Social Determinants of Health (SDOH) Interventions    Readmission Risk Interventions No flowsheet data found.

## 2019-12-24 DIAGNOSIS — R627 Adult failure to thrive: Secondary | ICD-10-CM

## 2019-12-24 DIAGNOSIS — Z7189 Other specified counseling: Secondary | ICD-10-CM

## 2019-12-24 DIAGNOSIS — Z66 Do not resuscitate: Secondary | ICD-10-CM

## 2019-12-24 DIAGNOSIS — Z515 Encounter for palliative care: Secondary | ICD-10-CM

## 2019-12-24 LAB — BASIC METABOLIC PANEL
Anion gap: 10 (ref 5–15)
BUN: 28 mg/dL — ABNORMAL HIGH (ref 8–23)
CO2: 24 mmol/L (ref 22–32)
Calcium: 8.5 mg/dL — ABNORMAL LOW (ref 8.9–10.3)
Chloride: 101 mmol/L (ref 98–111)
Creatinine, Ser: 0.88 mg/dL (ref 0.44–1.00)
GFR calc Af Amer: >60 mL/min (ref >60)
GFR calc non Af Amer: 56 mL/min — ABNORMAL LOW (ref >60)
Glucose, Bld: 134 mg/dL — ABNORMAL HIGH (ref 70–99)
Potassium: 3.2 mmol/L — ABNORMAL LOW (ref 3.5–5.1)
Sodium: 135 mmol/L (ref 135–145)

## 2019-12-24 LAB — CBC WITH DIFFERENTIAL/PLATELET
Abs Immature Granulocytes: 0.23 10*3/uL — ABNORMAL HIGH (ref 0.00–0.07)
Basophils Absolute: 0.1 10*3/uL (ref 0.0–0.1)
Basophils Relative: 1 %
Eosinophils Absolute: 0.1 10*3/uL (ref 0.0–0.5)
Eosinophils Relative: 1 %
HCT: 30.8 % — ABNORMAL LOW (ref 36.0–46.0)
Hemoglobin: 10.5 g/dL — ABNORMAL LOW (ref 12.0–15.0)
Immature Granulocytes: 2 %
Lymphocytes Relative: 11 %
Lymphs Abs: 1.6 10*3/uL (ref 0.7–4.0)
MCH: 31.5 pg (ref 26.0–34.0)
MCHC: 34.1 g/dL (ref 30.0–36.0)
MCV: 92.5 fL (ref 80.0–100.0)
Monocytes Absolute: 1.6 10*3/uL — ABNORMAL HIGH (ref 0.1–1.0)
Monocytes Relative: 11 %
Neutro Abs: 10.3 10*3/uL — ABNORMAL HIGH (ref 1.7–7.7)
Neutrophils Relative %: 74 %
Platelets: 282 10*3/uL (ref 150–400)
RBC: 3.33 MIL/uL — ABNORMAL LOW (ref 3.87–5.11)
RDW: 14.2 % (ref 11.5–15.5)
WBC: 13.8 10*3/uL — ABNORMAL HIGH (ref 4.0–10.5)
nRBC: 0 % (ref 0.0–0.2)

## 2019-12-24 LAB — MAGNESIUM: Magnesium: 1.8 mg/dL (ref 1.7–2.4)

## 2019-12-24 LAB — GLUCOSE, CAPILLARY: Glucose-Capillary: 162 mg/dL — ABNORMAL HIGH (ref 70–99)

## 2019-12-24 MED ORDER — HYPROMELLOSE (GONIOSCOPIC) 2.5 % OP SOLN: 1.0000 [drp] | Freq: Four times a day (QID) | OPHTHALMIC | Status: DC | PRN

## 2019-12-24 MED ORDER — POLYVINYL ALCOHOL 1.4 % OP SOLN
1.0000 [drp] | OPHTHALMIC | Status: DC | PRN
  Filled 2019-12-24: qty 15

## 2019-12-24 MED ORDER — ENOXAPARIN SODIUM 40 MG/0.4ML ~~LOC~~ SOLN
40.0000 mg | SUBCUTANEOUS | Status: DC
  Administered 2019-12-24: 22:00:00 40 mg via SUBCUTANEOUS
  Filled 2019-12-24: qty 0.4

## 2019-12-24 MED ORDER — LORATADINE 10 MG PO TABS: 10.0000 mg | ORAL_TABLET | Freq: Every day | ORAL | Status: DC | PRN

## 2019-12-24 MED ORDER — QUETIAPINE FUMARATE 25 MG PO TABS
12.5000 mg | ORAL_TABLET | Freq: Every day | ORAL | Status: DC
  Administered 2019-12-24: 22:00:00 12.5 mg via ORAL
  Filled 2019-12-24: qty 1

## 2019-12-24 MED ORDER — POTASSIUM CHLORIDE CRYS ER 20 MEQ PO TBCR
40.0000 meq | EXTENDED_RELEASE_TABLET | ORAL | Status: AC
  Administered 2019-12-24 (×2): 40 meq via ORAL
  Filled 2019-12-24 (×2): qty 2

## 2019-12-24 NOTE — Progress Notes (Signed)
   12/24/19 1130  Clinical Encounter Type  Visited With Patient;Family  Visit Type Initial  Referral From Nurse  Consult/Referral To Chaplain  Visited with Pt while rounding unit on 1C. Pt and daughter were sitting in room. Ch talk to Pt daughter and let her know that he was there for support. Ch ask Pt daughter is there anything else he could do. Pt daughter said they were fine right now. Ch will follow-up with Pt and family.

## 2019-12-24 NOTE — Progress Notes (Addendum)
[facility] Liaison Note. New referral for [facility] hospice services at [facility] received from [facility]. Patient information given to referral. Hospice eligibility confirmed.  Writer met in the room with patient's family to initiate education regarding hospice services, philosophy and team approach to care with understanding voiced.  DME needs discussed with TOC. Hospital bed and overbed table have been ordered for delivery tomorrow with planned discharge following. Patient will require EMS transport with signed out of facility DNR in place. Will continue to follow through discharge.  Thank you for the opportunity to be involved in the care of this patient and her family.  Flo Shanks BSN, RN, [place] [phone]

## 2019-12-24 NOTE — Consult Note (Signed)
Consultation Note Date: 12/24/2019   Patient Name: Tina Campbell  DOB: 1923-03-26  MRN: [id number]  Age / Sex: 86 y.o., female  PCP: [facility] Referring Physician: Aline August, MD  Reason for Consultation: Establishing goals of care  HPI/Patient Profile: 86 y.o. female  with past medical history of dmentia, HTN, CKD 3, depression, and hypothyroidism admitted on 12/22/2019 with AMS. Treated for UTI. PO intake has been poor and patient has remained lethargic. AKI has improved. PMT consulted for [place].  Clinical Assessment and Goals of Care: I have reviewed medical records including EPIC notes, labs and imaging, received report from RN and Belenda Cruise SLP, assessed the patient and then met with patient's son and daughter to discuss diagnosis prognosis, GOC, EOL wishes, disposition and options.  I introduced Palliative Medicine as specialized medical care for people living with serious illness. It focuses on providing relief from the symptoms and stress of a serious illness. The goal is to improve quality of life for both the patient and the family.  Family tells me of decline since October 2020 with more frequent falls and more weakness - becoming essentially wheelchair bound. They tell me of progressive loss of speech. Poor PO intake.    We discussed patient's current illness and what it means in the larger context of patient's on-going co-morbidities.  Natural disease trajectory and expectations at EOL were discussed. Discussed progressive nature of dementia with Tina Campbell showing signs of late stages with loss of ambulation and loss of speech. Discussed expectation of continued decline. Discussed expectation of worsening PO intake as disease progresses. Discussed likely prognosis of months and what to look for to indicate worsening prognosis.   I attempted to elicit values and goals of care important to the patient. Family  expresses a desire to focus on patient's comfort and avoid aggressive medical interventions.   Discussed with patient/family the importance of continued conversation with family and the medical providers regarding overall plan of care and treatment options, ensuring decisions are within the context of the patient's values and GOCs.    Hospice services outpatient were explained and offered. Family is interested in hospice care at patient's ALF.   Questions and concerns were addressed. The family was encouraged to call with questions or concerns.   Primary Decision Maker NEXT OF KIN - daughter Jocelyn Lamer and son Quita Skye    SUMMARY OF RECOMMENDATIONS   - hospice care at ALF - [facility] aware  Code Status/Advance Care Planning:  DNR   Symptom Management:   seroquel initiated by MD  Prognosis:   < 6 months  Discharge Planning: ALF with hospice      Primary Diagnoses: Present on Admission: . Hypertension . UTI (urinary tract infection) . Acute metabolic encephalopathy . Hypokalemia . Acute renal failure superimposed on stage 3a chronic kidney disease ([place]) . Dementia (Tina Campbell) . Hypothyroidism . Depression . Metabolic encephalopathy   I have reviewed the medical record, interviewed the patient and family, and examined the patient. The following aspects are pertinent.  Past Medical History:  Diagnosis Date  . Dementia (Tina Campbell)   . Hypertension    Social History   Socioeconomic History  . Marital status: Widowed    Spouse name: Not on file  . Number of children: Not on file  . Years of education: Not on file  . Highest education level: Not on file  Occupational History  . Not on file  Tobacco Use  . Smoking status: Never Smoker  . Smokeless tobacco: Never Used  Substance and Sexual Activity  . Alcohol use: Not Currently  . Drug use: Never  . Sexual activity: Not on file  Other Topics Concern  . Not on file  Social History Narrative  . Not on file   Social Determinants of  Health   Financial Resource Strain:   . Difficulty of Paying Living Expenses:   Food Insecurity:   . Worried About Charity fundraiser in the Last Year:   . Arboriculturist in the Last Year:   Transportation Needs:   . Film/video editor (Medical):   Marland Kitchen Lack of Transportation (Non-Medical):   Physical Activity:   . Days of Exercise per Week:   . Minutes of Exercise per Session:   Stress:   . Feeling of Stress :   Social Connections:   . Frequency of Communication with Friends and Family:   . Frequency of Social Gatherings with Friends and Family:   . Attends Religious Services:   . Active Member of Clubs or Organizations:   . Attends Archivist Meetings:   Marland Kitchen Marital Status:    History reviewed. No pertinent family history. Scheduled Meds: . calcium carbonate  600 mg of elemental calcium Oral Q breakfast  . cholecalciferol  1,000 Units Oral QHS  . divalproex  125 mg Oral BID  . dorzolamide-timolol  1 drop Both Eyes BID  . enoxaparin (LOVENOX) injection  40 mg Subcutaneous Q24H  . FLUoxetine  40 mg Oral Daily  . latanoprost  1 drop Both Eyes QHS  . levothyroxine  37.5 mcg Oral Once per day on Mon Fri  . levothyroxine  75 mcg Oral Once per day on Sun Tue Wed Thu Sat  . memantine  10 mg Oral BID  . mirtazapine  15 mg Oral QHS  . multivitamin with minerals  1 tablet Oral QHS  . neomycin-bacitracin-polymyxin  1 application Topical Daily  . QUEtiapine  12.5 mg Oral QHS   Continuous Infusions: . cefTRIAXone (ROCEPHIN)  IV 1 g (12/24/19 1222)   PRN Meds:.acetaminophen, hydrALAZINE, loratadine, ondansetron (ZOFRAN) IV Allergies  Allergen Reactions  . Aricept [Donepezil]   . Bee Pollen   . Chocolate   . Fire Dynegy   . Peanut Butter Flavor   . Penicillins    Review of Systems  Unable to perform ROS: Dementia    Physical Exam Constitutional:      General: She is not in acute distress.    Comments: Minimally verbal, mostly sleeping  Pulmonary:     Effort:  Pulmonary effort is normal. No respiratory distress.  Skin:    General: Skin is warm and dry.  Neurological:     Mental Status: She is disoriented.     Vital Signs: BP (!) 146/35 (BP Location: Left Arm)   Pulse 71   Temp 98.7 F (37.1 C) (Tympanic)   Resp 18   Ht '5\' 4"'$  (1.626 m)   Wt 58.9 kg   SpO2 97%   BMI 22.30 kg/m  Pain Scale: 0-10   Pain Score: 0-No pain   SpO2: SpO2: 97 % O2 Device:SpO2: 97 % O2 Flow Rate: .   IO: Intake/output summary:   Intake/Output Summary (Last 24 hours) at 12/24/2019 1416 Last data filed at 12/24/2019 0920 Gross per 24 hour  Intake 1208.32 ml  Output 800 ml  Net 408.32 ml    LBM:   Baseline Weight: Weight: 58.9 kg Most recent weight: Weight: 58.9 kg     Palliative Assessment/Data: PPS 30%  Time Total: 50 minutes Greater than 50%  of this time was spent counseling and coordinating care related to the above assessment and plan.  Juel Burrow, DNP, AGNP-C Palliative Medicine Team [phone] Pager: [phone]

## 2019-12-24 NOTE — Progress Notes (Addendum)
Patient ID: Tina Campbell, female   DOB: [birth year], 86 y.o.   MRN: [id number]  PROGRESS NOTE    Tina Campbell  OZD:[id number] DOB: 1922-08-15 DOA: 12/22/2019 PCP: [facility]   Brief Narrative:  86 year old female with history of hypertension, dementia,  chronic renal disease stage III, depression, hypothyroidism, overall worsening general health over the last few weeks presented with altered mental status.  On presentation, UA was suggestive of UTI.  CT head was negative for acute intracranial abnormality.  She was started on IV antibiotics.  Assessment & Plan:   Probable UTI: Present on admission -Continue Rocephin.  Cultures negative so far.    Acute metabolic encephalopathy History of dementia -Patient's overall status has been declining recently. -Mental status is not improving.  Monitor mental status.  Fall precaution.  Delirium precautions. -CT head was negative for acute abnormality -If condition does not improve in the next 24-48 hours, consider hospice/comfort measures.  Palliative care evaluation for goals of care discussion. -Continue Namenda -We will start low-dose Seroquel 12.5 mg at night: Son and daughter agreeable for that.  Leukocytosis -Probably from UTI.  Monitor  Acute kidney injury on chronic renal disease stage IIIa -Presented with creatinine of 1.73.  Baseline creatinine of 1 on 05/23/2019.  Creatinine 0.88 this morning.  Monitor.  Hyponatremia -improved  Hypokalemia -Replace.  Repeat a.m. labs.  Hypertension -Monitor blood pressure.  Antihypertensives on hold  Hypothyroidism -Synthroid  Depression and anxiety -Continue Depakote, Prozac and Remeron    DVT prophylaxis: Lovenox Code Status: Full Family Communication: Son and daughter at bedside on 12/24/2019 Disposition Plan: Status is: Inpatient  Patient will require inpatient care because of persistent altered mental status, overall declining condition and poor oral  intake.  Might need hospice.  Dispo: The patient is from: Memory care/assisted living facility              Anticipated d/c is to: SNF with hospice versus home hospice              Anticipated d/c date is: 1 day              Patient currently is not medically stable to d/c.  Consultants: palliative care consultation pending  Procedures: None  Antimicrobials: Rocephin from 12/22/2019 onwards   Subjective: Patient seen and examined at bedside.  Poor historian, slightly awake, hardly answers any questions.  Daughter reports that patient had almost a sleepless night last night.  No overnight fever, vomiting, agitation reported by nursing staff.  Objective: Vitals:   12/23/19 1500 12/23/19 2006 12/24/19 0008 12/24/19 0455  BP:  (!) 150/54 (!) 169/56 (!) 151/55  Pulse:  75 70 73  Resp:  18 20 20   Temp: 98.1 F (36.7 C) 98.3 F (36.8 C) 97.7 F (36.5 C) (!) 97.5 F (36.4 C)  TempSrc: Oral   Oral  SpO2: 97% 96% 100% 93%  Weight:      Height:        Intake/Output Summary (Last 24 hours) at 12/24/2019 0725 Last data filed at 12/24/2019 0500 Gross per 24 hour  Intake 968.32 ml  Output 1100 ml  Net -131.68 ml   Filed Weights   12/22/19 0924  Weight: 58.9 kg    Examination:  General exam: No acute distress.  Elderly female lying in bed. Respiratory system: Bilateral decreased breath sounds at bases, no wheezing.  Some crackles  cardiovascular system: Rate controlled, S1-S2 heard Gastrointestinal system: Abdomen is nondistended, soft and nontender.  Bowel sounds are heard  extremities: Trace lower extremity edema.  No clubbing Central nervous system: Wakes up slightly, hardly participates in any conversation.  Confused.  Poor historian.  No focal neurological deficits. Moving extremities Skin: No obvious ulcers or rashes  psychiatry: Cannot assess because of mental status    Data Reviewed: I have personally reviewed following labs and imaging studies  CBC: Recent Labs  Lab  12/22/19 0927 12/23/19 0522 12/24/19 0510  WBC 11.7* 13.8* 13.8*  NEUTROABS 8.2*  --  10.3*  HGB 11.0* 10.9* 10.5*  HCT 31.6* 30.7* 30.8*  MCV 89.8 88.5 92.5  PLT 253 261 282   Basic Metabolic Panel: Recent Labs  Lab 12/22/19 0927 12/23/19 0522 12/24/19 0510  NA 130* 134* 135  K 3.3* 3.7 3.2*  CL 91* 100 101  CO2 25 24 24   GLUCOSE 139* 122* 134*  BUN 61* 44* 28*  CREATININE 1.73* 1.20* 0.88  CALCIUM 9.2 8.7* 8.5*  MG  --  1.9 1.8   GFR: Estimated Creatinine Clearance: 33.8 mL/min (by C-G formula based on SCr of 0.88 mg/dL). Liver Function Tests: Recent Labs  Lab 12/22/19 0927  AST 33  ALT 19  ALKPHOS 56  BILITOT 0.6  PROT 6.7  ALBUMIN 2.6*   No results for input(s): LIPASE, AMYLASE in the last 168 hours. No results for input(s): AMMONIA in the last 168 hours. Coagulation Profile: No results for input(s): INR, PROTIME in the last 168 hours. Cardiac Enzymes: No results for input(s): CKTOTAL, CKMB, CKMBINDEX, TROPONINI in the last 168 hours. BNP (last 3 results) No results for input(s): PROBNP in the last 8760 hours. HbA1C: No results for input(s): HGBA1C in the last 72 hours. CBG: No results for input(s): GLUCAP in the last 168 hours. Lipid Profile: No results for input(s): CHOL, HDL, LDLCALC, TRIG, CHOLHDL, LDLDIRECT in the last 72 hours. Thyroid Function Tests: No results for input(s): TSH, T4TOTAL, FREET4, T3FREE, THYROIDAB in the last 72 hours. Anemia Panel: No results for input(s): VITAMINB12, FOLATE, FERRITIN, TIBC, IRON, RETICCTPCT in the last 72 hours. Sepsis Labs: No results for input(s): PROCALCITON, LATICACIDVEN in the last 168 hours.  Recent Results (from the past 240 hour(s))  SARS CORONAVIRUS 2 (TAT 6-24 HRS) Nasopharyngeal Nasopharyngeal Swab     Status: None   Collection Time: 12/22/19 12:03 PM   Specimen: Nasopharyngeal Swab  Result Value Ref Range Status   SARS Coronavirus 2 NEGATIVE NEGATIVE Final    Comment: (NOTE) SARS-CoV-2  target nucleic acids are NOT DETECTED.  The SARS-CoV-2 RNA is generally detectable in upper and lower respiratory specimens during the acute phase of infection. Negative results do not preclude SARS-CoV-2 infection, do not rule out co-infections with other pathogens, and should not be used as the sole basis for treatment or other patient management decisions. Negative results must be combined with clinical observations, patient history, and epidemiological information. The expected result is Negative.  Fact Sheet for Patients: 12/24/19  Fact Sheet for Healthcare Providers: HairSlick.no  This test is not yet approved or cleared by the quierodirigir.com FDA and  has been authorized for detection and/or diagnosis of SARS-CoV-2 by FDA under an Emergency Use Authorization (EUA). This EUA will remain  in effect (meaning this test can be used) for the duration of the COVID-19 declaration under Se ction 564(b)(1) of the Act, 21 U.S.C. section 360bbb-3(b)(1), unless the authorization is terminated or revoked sooner.  Performed at [facility], [address] [place], [place]   CULTURE, BLOOD (ROUTINE X 2) w Reflex  to ID Panel     Status: None (Preliminary result)   Collection Time: 12/22/19 12:32 PM   Specimen: BLOOD  Result Value Ref Range Status   Specimen Description BLOOD LEFT ANTECUBITAL  Final   Special Requests   Final    BOTTLES DRAWN AEROBIC AND ANAEROBIC Blood Culture results may not be optimal due to an inadequate volume of blood received in culture bottles   Culture   Final    NO GROWTH < 24 HOURS Performed at [facility], [address] [place], [place] [address]    Report Status PENDING  Incomplete  CULTURE, BLOOD (ROUTINE X 2) w Reflex to ID Panel     Status: None (Preliminary result)   Collection Time: 12/22/19 12:32 PM   Specimen: BLOOD  Result Value Ref Range Status   Specimen  Description BLOOD RIGHT ANTECUBITAL  Final   Special Requests   Final    BOTTLES DRAWN AEROBIC AND ANAEROBIC Blood Culture adequate volume   Culture   Final    NO GROWTH < 24 HOURS Performed at [facility], [address], [place], [place] [address]    Report Status PENDING  Incomplete         Radiology Studies: CT Head Wo Contrast  Result Date: 12/22/2019 CLINICAL DATA:  Altered mental status. EXAM: CT HEAD WITHOUT CONTRAST TECHNIQUE: Contiguous axial images were obtained from the base of the skull through the vertex without intravenous contrast. COMPARISON:  None. FINDINGS: Brain: No acute intracranial hemorrhage. No focal mass lesion. No CT evidence of acute infarction. No midline shift or mass effect. No hydrocephalus. Basilar cisterns are patent. There are periventricular and subcortical white matter hypodensities. Generalized cortical atrophy. Vascular: No hyperdense vessel or unexpected calcification. Skull: Normal. Negative for fracture or focal lesion. Sinuses/Orbits: Paranasal sinuses and mastoid air cells are clear. Orbits are clear. Other: None. IMPRESSION: 1. No acute intracranial findings. 2. Atrophy and white matter microvascular disease. Electronically Signed   By: Genevive Bi M.D.   On: 12/22/2019 11:03        Scheduled Meds:  calcium carbonate  600 mg of elemental calcium Oral Q breakfast   cholecalciferol  1,000 Units Oral QHS   divalproex  125 mg Oral BID   dorzolamide-timolol  1 drop Both Eyes BID   enoxaparin (LOVENOX) injection  30 mg Subcutaneous Q24H   FLUoxetine  40 mg Oral Daily   latanoprost  1 drop Both Eyes QHS   levothyroxine  37.5 mcg Oral Once per day on Mon Fri   levothyroxine  75 mcg Oral Once per day on Sun Tue Wed Thu Sat   memantine  10 mg Oral BID   mirtazapine  15 mg Oral QHS   multivitamin with minerals  1 tablet Oral QHS   neomycin-bacitracin-polymyxin  1 application Topical Daily   Continuous Infusions:  sodium  chloride Stopped (12/23/19 1715)   cefTRIAXone (ROCEPHIN)  IV Stopped (12/23/19 1238)          Glade Lloyd, MD [facility] 12/24/2019, 7:25 AM

## 2019-12-24 NOTE — Progress Notes (Signed)
SLP Cancellation Note  Patient Details Name: Tina Campbell MRN: [id number] DOB: 09/24/1922   Cancelled treatment:       Reason Eval/Treat Not Completed: Fatigue/lethargy limiting ability to participate (chart reviewed; consulted NSG then met w/ family in room). Pt asleep and resting. She ate a "good" breakfast earlier this morning but has not demonstrated interest in eating another meal yet per family. Discussed pt's general swallowing as impacted by Cognitive decline/Dementia then behavioral strategies that could be utilized to engage pt during meal including self-feeding as well as decrease stimulation in environment that could be distracting to her. Family agreed.  ST services will monitor pt's status while admitted. Recommend continue a mech soft diet w/ thin liquids for the cut meats and ease; general aspiration precautions and Pills in Puree as needed d/t Cognitive decline.     Orinda Kenner, MS, CCC-SLP Romone Shaff 12/24/2019, 1:31 PM

## 2019-12-24 NOTE — Progress Notes (Signed)
PHARMACIST - PHYSICIAN COMMUNICATION  CONCERNING:  Enoxaparin (Lovenox) for DVT Prophylaxis    RECOMMENDATION: Patient was prescribed enoxaprin 30mg  q24 hours for VTE prophylaxis and CrCl <35ml/min   Filed Weights   12/22/19 0924  Weight: 58.9 kg (129 lb 14.4 oz)    Body mass index is 22.3 kg/m.  Estimated Creatinine Clearance: 33.8 mL/min (by C-G formula based on SCr of 0.88 mg/dL).   Patient is candidate for enoxaparin 40mg  every 24 hours based on CrCl >76ml/min and  Weight > 45kg   DESCRIPTION: Pharmacy has adjusted enoxaparin dose per [facility] policy.  Patient is now receiving enoxaparin 40mg  every 24 hours.   31m, PharmD, BCPS Clinical Pharmacist 12/24/2019 9:23 AM

## 2019-12-24 NOTE — TOC Progression Note (Signed)
Transition of Care [facility]) - Progression Note    Patient Details  Name: Tina Campbell MRN: [id number] Date of Birth: 03/31/1923  [facility] of [facility]) CM/SW Contact  Shelbie Ammons, RN Phone Number: 12/24/2019, 3:11 PM  Clinical Narrative:   RNCM met with daughter at bedside. She reported that they are waiting on Palliative consult which she thinks will happen today. She is interested in her mom going back to [facility] if that is what Palliative thinks is best.   RNCM spoke with Palliative provider prior to and after consultation, patient will return to [facility] with Hospice services. Placed call to [place] at [facility] and she was agreeable to this plan.   RNCM returned call to patient's daughter Olegario Shearer to let her know patient can return. After discussion patient's children request [facility] for services. Patient will need hospital bed and overbed table. Santiago Glad with [facility] given referral.     Expected Discharge Plan: Memory Care Barriers to Discharge: Continued Medical Work up  Expected Discharge Plan and Services Expected Discharge Plan: Memory Care In-house Referral: Clinical Social Work     Living arrangements for the past 2 months: [place]                                       Social Determinants of Health (SDOH) Interventions    Readmission Risk Interventions No flowsheet data found.

## 2019-12-25 DIAGNOSIS — N3001 Acute cystitis with hematuria: Principal | ICD-10-CM

## 2019-12-25 DIAGNOSIS — R778 Other specified abnormalities of plasma proteins: Secondary | ICD-10-CM

## 2019-12-25 DIAGNOSIS — E039 Hypothyroidism, unspecified: Secondary | ICD-10-CM

## 2019-12-25 LAB — URINE CULTURE: Culture: >=100000 — AB

## 2019-12-25 MED ORDER — CEPHALEXIN 500 MG PO CAPS: 500.0000 mg | ORAL_CAPSULE | Freq: Three times a day (TID) | ORAL | 0 refills | Status: AC

## 2019-12-25 MED ORDER — AMLODIPINE BESYLATE 5 MG PO TABS: 5.0000 mg | ORAL_TABLET | Freq: Every day | ORAL | Status: DC

## 2019-12-25 NOTE — Discharge Instructions (Signed)
Urinary Tract Infection, Adult A urinary tract infection (UTI) is an infection of any part of the urinary tract. The urinary tract includes:  The kidneys.  The ureters.  The bladder.  The urethra. These organs make, store, and get rid of pee (urine) in the body. What are the causes? This is caused by germs (bacteria) in your genital area. These germs grow and cause swelling (inflammation) of your urinary tract. What increases the risk? You are more likely to develop this condition if:  You have a small, thin tube (catheter) to drain pee.  You cannot control when you pee or poop (incontinence).  You are female, and: ? You use these methods to prevent pregnancy:  A medicine that kills sperm (spermicide).  A device that blocks sperm (diaphragm). ? You have low levels of a female hormone (estrogen). ? You are pregnant.  You have genes that add to your risk.  You are sexually active.  You take antibiotic medicines.  You have trouble peeing because of: ? A prostate that is bigger than normal, if you are female. ? A blockage in the part of your body that drains pee from the bladder (urethra). ? A kidney stone. ? A nerve condition that affects your bladder (neurogenic bladder). ? Not getting enough to drink. ? Not peeing often enough.  You have other conditions, such as: ? Diabetes. ? A weak disease-fighting system (immune system). ? Sickle cell disease. ? Gout. ? Injury of the spine. What are the signs or symptoms? Symptoms of this condition include:  Needing to pee right away (urgently).  Peeing often.  Peeing small amounts often.  Pain or burning when peeing.  Blood in the pee.  Pee that smells bad or not like normal.  Trouble peeing.  Pee that is cloudy.  Fluid coming from the vagina, if you are female.  Pain in the belly or lower back. Other symptoms include:  Throwing up (vomiting).  No urge to eat.  Feeling mixed up (confused).  Being tired  and grouchy (irritable).  A fever.  Watery poop (diarrhea). How is this treated? This condition may be treated with:  Antibiotic medicine.  Other medicines.  Drinking enough water. Follow these instructions at home:  Medicines  Take over-the-counter and prescription medicines only as told by your doctor.  If you were prescribed an antibiotic medicine, take it as told by your doctor. Do not stop taking it even if you start to feel better. General instructions  Make sure you: ? Pee until your bladder is empty. ? Do not hold pee for a long time. ? Empty your bladder after sex. ? Wipe from front to back after pooping if you are a female. Use each tissue one time when you wipe.  Drink enough fluid to keep your pee pale yellow.  Keep all follow-up visits as told by your doctor. This is important. Contact a doctor if:  You do not get better after 1-2 days.  Your symptoms go away and then come back. Get help right away if:  You have very bad back pain.  You have very bad pain in your lower belly.  You have a fever.  You are sick to your stomach (nauseous).  You are throwing up. Summary  A urinary tract infection (UTI) is an infection of any part of the urinary tract.  This condition is caused by germs in your genital area.  There are many risk factors for a UTI. These include having a small, thin   tube to drain pee and not being able to control when you pee or poop.  Treatment includes antibiotic medicines for germs.  Drink enough fluid to keep your pee pale yellow. This information is not intended to replace advice given to you by your health care provider. Make sure you discuss any questions you have with your health care provider. Document Revised: 06/07/2018 Document Reviewed: 12/28/2017 Elsevier Patient Education  2020 [employer]  

## 2019-12-25 NOTE — NC FL2 (Signed)
[place] LEVEL OF CARE SCREENING TOOL     IDENTIFICATION  Patient Name: Tina Campbell Birthdate: 23-Apr-1923 Sex: female Admission Date (Current Location): 12/22/2019  [place] and [place] Number:  Engineering geologist and Address:  [facility], [address] [place], [place] [address]      Provider Number: [id number]  Attending Physician Name and Address:  Loletha Grayer, MD  Relative Name and Phone Number:  Jocelyn Lamer Counts [phone]    Current Level of Care: Hospital Recommended Level of Care: [place] Prior Approval Number:    Date Approved/Denied:   PASRR Number:    Discharge Plan: Domiciliary (Rest home) (assisted living)    Current Diagnoses: Patient Active Problem List   Diagnosis Date Noted  . Elevated troponin   . Adult failure to thrive   . Goals of care, counseling/discussion   . DNR (do not resuscitate)   . Palliative care by specialist   . Metabolic encephalopathy 13/24/4010  . Hypertension   . UTI (urinary tract infection)   . Acute metabolic encephalopathy   . Hypokalemia   . Acute renal failure superimposed on stage 3a chronic kidney disease ([place])   . Dementia ([place])   . Hypothyroidism   . Depression     Orientation RESPIRATION BLADDER Height & Weight     Self  Normal Incontinent Weight: 58.9 kg Height:  5\' 4"  (162.6 cm)  BEHAVIORAL SYMPTOMS/MOOD NEUROLOGICAL BOWEL NUTRITION STATUS      Incontinent Diet (Dysphagia)  AMBULATORY STATUS COMMUNICATION OF NEEDS Skin   Total Care Verbally Normal                       Personal Care Assistance Level of Assistance  Total care       Total Care Assistance: Maximum assistance   Functional Limitations Info  Hearing   Hearing Info: Impaired      SPECIAL CARE FACTORS FREQUENCY                       Contractures Contractures Info: Not present    Additional Factors Info  Code Status, Allergies Code Status  Info: DNR Allergies Info: aricept, bee pollen, chocolate, fire ant, peanut butter flavor, PCN           Current Medications (12/25/2019):  This is the current hospital active medication list Current Facility-Administered Medications  Medication Dose Route Frequency Provider Last Rate Last Admin  . acetaminophen (TYLENOL) tablet 650 mg  650 mg Oral Q6H PRN Ivor Costa, MD      . amLODipine (NORVASC) tablet 5 mg  5 mg Oral Daily Wieting, Richard, MD      . calcium carbonate (TUMS - dosed in mg elemental calcium) chewable tablet 600 mg of elemental calcium  600 mg of elemental calcium Oral Q breakfast Ivor Costa, MD   600 mg of elemental calcium at 12/24/19 0922  . cefTRIAXone (ROCEPHIN) 1 g in sodium chloride 0.9 % 100 mL IVPB  1 g Intravenous Q24H Ivor Costa, MD   Stopped at 12/24/19 1252  . cholecalciferol (VITAMIN D) tablet 1,000 Units  1,000 Units Oral Gordan Payment, MD   1,000 Units at 12/24/19 2212  . divalproex (DEPAKOTE) DR tablet 125 mg  125 mg Oral BID Ivor Costa, MD   125 mg at 12/24/19 2212  . dorzolamide-timolol (COSOPT) 22.3-6.8 MG/ML ophthalmic solution 1 drop  1 drop Both Eyes BID Ivor Costa, MD   1 drop  at 12/24/19 2212  . enoxaparin (LOVENOX) injection 40 mg  40 mg Subcutaneous Q24H Hallaji, Sheema M, RPH   40 mg at 12/24/19 2213  . FLUoxetine (PROZAC) capsule 40 mg  40 mg Oral Daily Lorretta Harp, MD   40 mg at 12/24/19 1638  . hydrALAZINE (APRESOLINE) injection 5 mg  5 mg Intravenous Q2H PRN Lorretta Harp, MD      . latanoprost (XALATAN) 0.005 % ophthalmic solution 1 drop  1 drop Both Eyes QHS Lorretta Harp, MD   1 drop at 12/24/19 2213  . levothyroxine (SYNTHROID) tablet 37.5 mcg  37.5 mcg Oral Once per day on Mon Fri Albina Billet, [place]   37.5 mcg at 12/23/19 0548  . levothyroxine (SYNTHROID) tablet 75 mcg  75 mcg Oral Once per day on Sun Tue Wed Thu Sat Lorretta Harp, MD   75 mcg at 12/25/19 4536  . loratadine (CLARITIN) tablet 10 mg  10 mg Oral Daily PRN Glade Lloyd, MD       . memantine [facility]) tablet 10 mg  10 mg Oral BID Lorretta Harp, MD   10 mg at 12/24/19 2212  . mirtazapine (REMERON) tablet 15 mg  15 mg Oral QHS Lorretta Harp, MD   15 mg at 12/24/19 2212  . multivitamin with minerals tablet 1 tablet  1 tablet Oral QHS Lorretta Harp, MD   1 tablet at 12/24/19 2212  . neomycin-bacitracin-polymyxin (NEOSPORIN) ointment packet 1 application  1 application Topical Daily Lorretta Harp, MD   1 application at 12/24/19 [phone]  . ondansetron (ZOFRAN) injection 4 mg  4 mg Intravenous Q8H PRN Lorretta Harp, MD      . polyvinyl alcohol (LIQUIFILM TEARS) 1.4 % ophthalmic solution 1 drop  1 drop Both Eyes Q4H PRN Ronnald Ramp, RPH      . QUEtiapine (SEROQUEL) tablet 12.5 mg  12.5 mg Oral QHS Glade Lloyd, MD   12.5 mg at 12/24/19 2212     Discharge Medications: Medication List    STOP taking these medications   calcium carbonate 1500 (600 Ca) MG Tabs tablet Commonly known as: OSCAL   Centrum Silver 50+Women Tabs   Cholecalciferol 25 MCG (1000 UT) capsule     TAKE these medications   amLODipine 5 MG tablet Commonly known as: NORVASC Take 5 mg by mouth daily. What changed: Another medication with the same name was removed. Continue taking this medication, and follow the directions you see here.   bimatoprost 0.01 % Soln Commonly known as: LUMIGAN Place 1 drop into both eyes at bedtime.   cephALEXin 500 MG capsule Commonly known as: KEFLEX Take 1 capsule (500 mg total) by mouth 3 (three) times daily for 3 days. Start taking on: December 26, 2019   divalproex 125 MG DR tablet Commonly known as: DEPAKOTE Take 125 mg by mouth 2 (two) times daily.   dorzolamide-timolol 22.3-6.8 MG/ML ophthalmic solution Commonly known as: COSOPT Place 1 drop into both eyes 2 (two) times daily.   FLUoxetine 20 MG capsule Commonly known as: PROZAC Take 40 mg by mouth daily.   levothyroxine 75 MCG tablet Commonly known as: SYNTHROID Take 75 mcg by mouth See admin  instructions. Take 1 tablet ( ) by mouth every Tuesday, Wednesday, Thursday, Saturday and Sunday morning before breakfast   levothyroxine 75 MCG tablet Commonly known as: SYNTHROID Take 37.5 mcg by mouth See admin instructions. Take  tablet (37.42mcg) by mouth every Monday and Friday morning before breakfast   memantine 10 MG tablet Commonly known as:  NAMENDA Take 10 mg by mouth 2 (two) times daily.   mirtazapine 15 MG tablet Commonly known as: REMERON Take 15 mg by mouth at bedtime.   neomycin-bacitracin-polymyxin ointment Commonly known as: NEOSPORIN Apply 1 application topically daily. (apply to lesion on right ear)         Relevant Imaging Results:  Relevant Lab Results:   Additional Information SS# [id number]  Allayne Butcher, RN

## 2019-12-25 NOTE — TOC Transition Note (Signed)
Transition of Care [facility]) - CM/SW Discharge Note   Patient Details  Name: Tina Campbell MRN: [id number] Date of Birth: 1922-07-11  Transition of Care [facility]) CM/SW Contact:  Allayne Butcher, RN Phone Number: 12/25/2019, 9:55 AM   Clinical Narrative:    Patient is ready to be discharged to [facility].  Patient will discharge with [facility] services.  Equipment will be delivered today to [facility].  Kendal Hymen from [facility] will call and let this RNCM know when equipment has arrived.  Patient will transport via Customer service manager.  This RNCM will set up transport once equipment has been delivered.    Final next level of care: Assisted Living (with [facility]) Barriers to Discharge: Barriers Resolved   Patient Goals and CMS Choice Patient states their goals for this hospitalization and ongoing recovery are:: Daughter in-law in room and provided information. Pt is sleeping and very HOH according to daughter in-law      Discharge Placement              Patient chooses bed at:  ([facility]) Patient to be transferred to facility by: [facility] Name of family member notified: Chip Boer- daughter Patient and family notified of of transfer: 12/25/19  Discharge Plan and Services In-house Referral: Clinical Social Work                                   Social Determinants of Health (SDOH) Interventions     Readmission Risk Interventions No flowsheet data found.

## 2019-12-25 NOTE — Progress Notes (Signed)
[facility] Liaison note:  Follow up visit made to new referral for [employer] hospice services at [facility]. Patient seen lying in bed, eyes closed appeared to be sleeping.   Daughter and daughter in law at bedside. All still in agreement with discharge to [facility] today via EMS. Discharge summary sent to referral. Signed out of facility DNR in place in discharge packet.  DME should be delivered by 1 pm today. CMRN Robbie Lis made aware. Thank you. Dayna Barker BSN, RN, [facility] [facility] [phone]

## 2019-12-25 NOTE — Progress Notes (Signed)
IV removed before discharge. Patient going back to [facility]. Patient being transported via EMS.

## 2019-12-25 NOTE — TOC Progression Note (Signed)
Transition of Care [facility]) - Progression Note    Patient Details  Name: Tina Campbell MRN: [id number] Date of Birth: 1922/07/08  Transition [facility] Care [facility]) CM/SW Contact  Allayne Butcher, RN Phone Number: 12/25/2019, 1:13 PM  Clinical Narrative:    Kendal Hymen, from the [facility] called and verified equipment has been delivered.  EMS has been arranged.  Patient is 3rd in line for EMS pickup.     Expected Discharge Plan: Memory Care Barriers to Discharge: Barriers Resolved  Expected Discharge Plan and Services Expected Discharge Plan: Memory Care In-house Referral: Clinical Social Work     Living arrangements for the past 2 months: Assisted Living Facility Expected Discharge Date: 12/25/19                                     Social Determinants of Health (SDOH) Interventions    Readmission Risk Interventions No flowsheet data found.

## 2019-12-25 NOTE — Discharge Summary (Signed)
[facility] [place] [facility]   PATIENT NAME: Tina Campbell    MR#:  [id number]  DATE OF BIRTH:  20-Jul-1922  DATE OF ADMISSION:  12/22/2019 ADMITTING PHYSICIAN: Glade Lloyd, MD  DATE OF DISCHARGE: 12/25/2019  PRIMARY CARE PHYSICIAN: [facility]    ADMISSION DIAGNOSIS:  UTI (urinary tract infection) [N39.0] Urinary tract infection without hematuria, site unspecified [N39.0] Altered mental status, unspecified altered mental status type [R41.82] Metabolic encephalopathy [G93.41]  DISCHARGE DIAGNOSIS:  Principal Problem:   UTI (urinary tract infection) Active Problems:   Hypertension   Acute metabolic encephalopathy   Hypokalemia   Acute renal failure superimposed on stage 3a chronic kidney disease (HCC)   Dementia (HCC)   Hypothyroidism   Depression   Metabolic encephalopathy   Adult failure to thrive   Goals of care, counseling/discussion   DNR (do not resuscitate)   Palliative care by specialist   SECONDARY DIAGNOSIS:   Past Medical History:  Diagnosis Date  . Dementia (HCC)   . Hypertension     HOSPITAL COURSE:   1.  Acute cystitis with hematuria with E. coli and Aerococcus growing out of the cultures.  Patient also had leukocytosis.  The patient has been on Rocephin since being here.  We will switch over to Keflex for tomorrow for 1 more day of antibiotics (3 more doses). 2.  Acute metabolic encephalopathy with underlying dementia.  The patient did give a dose of Seroquel last night and rested comfortably.  Still little groggy this morning.  Will discontinue Seroquel at this time and continue her Remeron and Depakote. 3.  Acute kidney injury on chronic kidney disease stage IIIa.  Improved with IV fluids. 4.  Elevated troponin likely secondary to acute kidney injury and demand ischemia from infection. 5.  Hyponatremia.  This has improved into the normal range with IV fluids 6.  Hypokalemia.  This was replaced during the  hospital course. 7.  Essential hypertension.  Restart Norvasc. 8.  Hypothyroidism unspecified on Synthroid 9.  Depression anxiety continue Depakote, Prozac and Remeron. 10.  Patient will be followed by hospice at facility.  Patient is a DNR. 11.  Patient is on dysphagia 3 diet with thin liquids. DISCHARGE CONDITIONS:  Fair  CONSULTS OBTAINED:  Palliative care  DRUG ALLERGIES:   Allergies  Allergen Reactions  . Aricept [Donepezil]   . Bee Pollen   . Chocolate   . Fire [employer]   . Peanut Butter Flavor   . Penicillins     DISCHARGE MEDICATIONS:   Allergies as of 12/25/2019      Reactions   Aricept [donepezil]    Bee Pollen    Chocolate    Fire Ant    Peanut Butter Flavor    Penicillins       Medication List    STOP taking these medications   calcium carbonate 1500 (600 Ca) MG Tabs tablet Commonly known as: OSCAL   Centrum Silver 50+Women Tabs   Cholecalciferol 25 MCG (1000 UT) capsule     TAKE these medications   amLODipine 5 MG tablet Commonly known as: NORVASC Take 5 mg by mouth daily. What changed: Another medication with the same name was removed. Continue taking this medication, and follow the directions you see here.   bimatoprost 0.01 % Soln Commonly known as: LUMIGAN Place 1 drop into both eyes at bedtime.   cephALEXin 500 MG capsule Commonly known as: KEFLEX Take 1 capsule (500 mg total) by mouth 3 (three) times daily for  3 days. Start taking on: December 26, 2019   divalproex 125 MG DR tablet Commonly known as: DEPAKOTE Take 125 mg by mouth 2 (two) times daily.   dorzolamide-timolol 22.3-6.8 MG/ML ophthalmic solution Commonly known as: COSOPT Place 1 drop into both eyes 2 (two) times daily.   FLUoxetine 20 MG capsule Commonly known as: PROZAC Take 40 mg by mouth daily.   levothyroxine 75 MCG tablet Commonly known as: SYNTHROID Take 75 mcg by mouth See admin instructions. Take 1 tablet ( ) by mouth every Tuesday, Wednesday, Thursday,  Saturday and Sunday morning before breakfast   levothyroxine 75 MCG tablet Commonly known as: SYNTHROID Take 37.5 mcg by mouth See admin instructions. Take  tablet (37.89mcg) by mouth every Monday and Friday morning before breakfast   memantine 10 MG tablet Commonly known as: NAMENDA Take 10 mg by mouth 2 (two) times daily.   mirtazapine 15 MG tablet Commonly known as: REMERON Take 15 mg by mouth at bedtime.   neomycin-bacitracin-polymyxin ointment Commonly known as: NEOSPORIN Apply 1 application topically daily. (apply to lesion on right ear)        DISCHARGE INSTRUCTIONS:   Follow-up doctors making house calls 5 days Follow-up with hospice  If you experience worsening of your admission symptoms, develop shortness of breath, life threatening emergency, suicidal or homicidal thoughts you must seek medical attention immediately by calling 911 or calling your MD immediately  if symptoms less severe.  You Must read complete instructions/literature along with all the possible adverse reactions/side effects for all the Medicines you take and that have been prescribed to you. Take any new Medicines after you have completely understood and accept all the possible adverse reactions/side effects.   Please note  You were cared for by a hospitalist during your hospital stay. If you have any questions about your discharge medications or the care you received while you were in the hospital after you are discharged, you can call the unit and asked to speak with the hospitalist on call if the hospitalist that took care of you is not available. Once you are discharged, your primary care physician will handle any further medical issues. Please note that NO REFILLS for any discharge medications will be authorized once you are discharged, as it is imperative that you return to your primary care physician (or establish a relationship with a primary care physician if you do not have one) for your  aftercare needs so that they can reassess your need for medications and monitor your lab values.    Today   CHIEF COMPLAINT:   Chief Complaint  Patient presents with  . Altered Mental Status    HISTORY OF PRESENT ILLNESS:  Ammarie Matsuura  is a 86 y.o. female came in with altered mental status   VITAL SIGNS:  Blood pressure (!) 174/68, pulse 76, temperature 98.6 F (37 C), temperature source Oral, resp. rate 18, height 5\' 4"  (1.626 m), weight 58.9 kg, SpO2 95 %.   PHYSICAL EXAMINATION:  GENERAL:  86 y.o.-year-old patient lying in the bed with no acute distress.  EYES: Pupils equal, round, reactive to light and accommodation. No scleral icterus.   HEENT: Head atraumatic, normocephalic.  LUNGS: Normal breath sounds bilaterally, no wheezing, rales,rhonchi or crepitation. No use of accessory muscles of respiration.  CARDIOVASCULAR: S1, S2 normal. No murmurs, rubs, or gallops.  ABDOMEN: Soft, non-tender.  EXTREMITIES: No pedal edema, cyanosis, or clubbing.   PSYCHIATRIC: The patient was awakened from sleep from Seroquel given last night.  Was  able to look at me but did not talk much.   DATA REVIEW:   CBC Recent Labs  Lab 12/24/19 0510  WBC 13.8*  HGB 10.5*  HCT 30.8*  PLT 282    Chemistries  Recent Labs  Lab 12/22/19 0927 12/23/19 0522 12/24/19 0510  NA 130*   < > 135  K 3.3*   < > 3.2*  CL 91*   < > 101  CO2 25   < > 24  GLUCOSE 139*   < > 134*  BUN 61*   < > 28*  CREATININE 1.73*   < > 0.88  CALCIUM 9.2   < > 8.5*  MG  --    < > 1.8  AST 33  --   --   ALT 19  --   --   ALKPHOS 56  --   --   BILITOT 0.6  --   --    < > = values in this interval not displayed.     Microbiology Results  Results for orders placed or performed during the hospital encounter of 12/22/19  Urine culture     Status: Abnormal   Collection Time: 12/22/19 11:08 AM   Specimen: Urine, Random  Result Value Ref Range Status   Specimen Description   Final    URINE,  RANDOM Performed at [facility], [address], [place], [place] [address]    Special Requests   Final    NONE Performed at [facility], [address] [place], [place] [address]    Culture (A)  Final    >=100,000 COLONIES/mL ESCHERICHIA COLI >=100,000 COLONIES/mL AEROCOCCUS URINAE Standardized susceptibility testing for this organism is not available. Performed at [facility], [address] [place], [place] [address]    Report Status 12/25/2019 FINAL  Final   Organism ID, Bacteria ESCHERICHIA COLI (A)  Final      Susceptibility   Escherichia coli - MIC*    AMPICILLIN <=2 SENSITIVE Sensitive     CEFAZOLIN <=4 SENSITIVE Sensitive     CEFTRIAXONE <=0.25 SENSITIVE Sensitive     CIPROFLOXACIN <=0.25 SENSITIVE Sensitive     GENTAMICIN <=1 SENSITIVE Sensitive     IMIPENEM <=0.25 SENSITIVE Sensitive     NITROFURANTOIN <=16 SENSITIVE Sensitive     TRIMETH/SULFA <=20 SENSITIVE Sensitive     AMPICILLIN/SULBACTAM <=2 SENSITIVE Sensitive     PIP/TAZO <=4 SENSITIVE Sensitive     * >=100,000 COLONIES/mL ESCHERICHIA COLI  SARS CORONAVIRUS 2 (TAT 6-24 HRS) Nasopharyngeal Nasopharyngeal Swab     Status: None   Collection Time: 12/22/19 12:03 PM   Specimen: Nasopharyngeal Swab  Result Value Ref Range Status   SARS Coronavirus 2 NEGATIVE NEGATIVE Final    Comment: (NOTE) SARS-CoV-2 target nucleic acids are NOT DETECTED.  The SARS-CoV-2 RNA is generally detectable in upper and lower respiratory specimens during the acute phase of infection. Negative results do not preclude SARS-CoV-2 infection, do not rule out co-infections with other pathogens, and should not be used as the sole basis for treatment or other patient management decisions. Negative results must be combined with clinical observations, patient history, and epidemiological information. The expected result is Negative.  Fact Sheet for Patients: HairSlick.no  Fact  Sheet for Healthcare Providers: quierodirigir.com  This test is not yet approved or cleared by the [place] FDA and  has been authorized for detection and/or diagnosis of SARS-CoV-2 by FDA under an Emergency Use Authorization (EUA). This EUA will remain  in  effect (meaning this test can be used) for the duration of the COVID-19 declaration under Se ction 564(b)(1) of the Act, 21 U.S.C. section 360bbb-3(b)(1), unless the authorization is terminated or revoked sooner.  Performed at [facility], [place] [address], [place], [place] [address]   CULTURE, BLOOD (ROUTINE X 2) w Reflex to ID Panel     Status: None (Preliminary result)   Collection Time: 12/22/19 12:32 PM   Specimen: BLOOD  Result Value Ref Range Status   Specimen Description BLOOD LEFT ANTECUBITAL  Final   Special Requests   Final    BOTTLES DRAWN AEROBIC AND ANAEROBIC Blood Culture results may not be optimal due to an inadequate volume of blood received in culture bottles   Culture   Final    NO GROWTH 3 DAYS Performed at [facility], [address] [place], [place] [address]    Report Status PENDING  Incomplete  CULTURE, BLOOD (ROUTINE X 2) w Reflex to ID Panel     Status: None (Preliminary result)   Collection Time: 12/22/19 12:32 PM   Specimen: BLOOD  Result Value Ref Range Status   Specimen Description BLOOD RIGHT ANTECUBITAL  Final   Special Requests   Final    BOTTLES DRAWN AEROBIC AND ANAEROBIC Blood Culture adequate volume   Culture   Final    NO GROWTH 3 DAYS Performed at [facility], [address], [place], [place] [address]    Report Status PENDING  Incomplete    Management plans discussed with the patient, family and they are in agreement.  CODE STATUS:     Code Status Orders  (From admission, onward)         Start     Ordered   12/22/19 1229  Do not attempt resuscitation (DNR)  Continuous       Question Answer Comment  In the event of  cardiac or respiratory ARREST Do not call a "code blue"   In the event of cardiac or respiratory ARREST Do not perform Intubation, CPR, defibrillation or ACLS   In the event of cardiac or respiratory ARREST Use medication by any route, position, wound care, and other measures to relive pain and suffering. May use oxygen, suction and manual treatment of airway obstruction as needed for comfort.      12/22/19 1229        Code Status History    Date Active Date Inactive Code Status Order ID Comments User Context   12/22/2019 1217 12/22/2019 1229 DNR [id number]  Ivor Costa, MD ED   Advance Care Planning Activity    Advance Directive Documentation     Most Recent Value  Type of Advance Directive Healthcare Power of Attorney, Living will  Pre-existing out of facility DNR order (yellow form or pink MOST form) --  "MOST" Form in Place? --      TOTAL TIME TAKING CARE OF THIS PATIENT: 31 minutes.    Loletha Grayer M.D on 12/25/2019 at 9:46 AM  Between 7am to 6pm - Pager - [phone]  After 6pm go to [email or web address] - password [email or web address]  [facility]  CC: Primary care physician; [facility]

## 2019-12-27 LAB — CULTURE, BLOOD (ROUTINE X 2)
Culture: NO GROWTH
Culture: NO GROWTH
Special Requests: ADEQUATE

## 2020-05-04 DEATH — deceased

## 2020-11-30 IMAGING — CT CT HEAD W/O CM
3 series · 16 of 47 positions shown, 19 images · non-contrast
Comparison: None.

CLINICAL DATA: Altered mental status.

EXAM:
CT HEAD WITHOUT CONTRAST
TECHNIQUE: Contiguous axial images were obtained from the base of the skull
through the vertex without intravenous contrast.

[Series 2: head wo · axial · 0.41mm/px · z∈[-79,+46]mm · 10 of 30 slices shown, 13 images]
[im 3/30  brain]
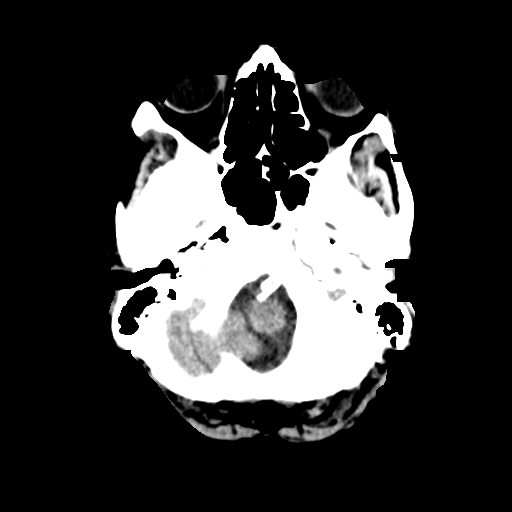
[im 3/30  bone]
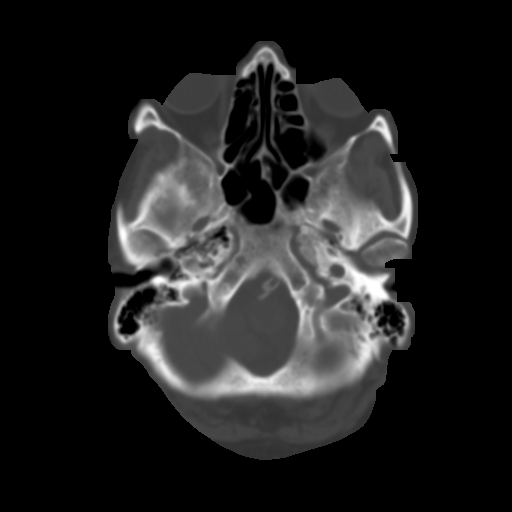
[im 6/30  brain]
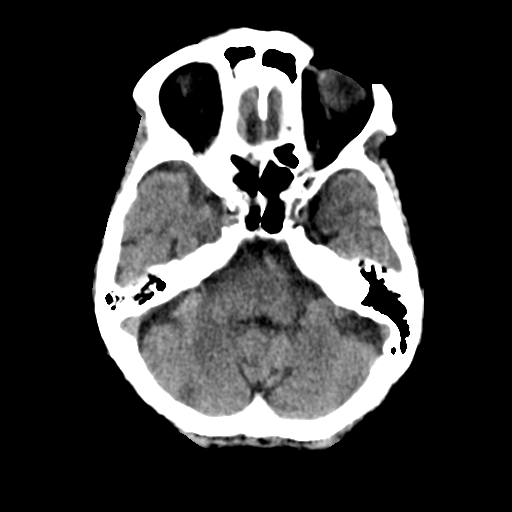
[im 9/30  brain]
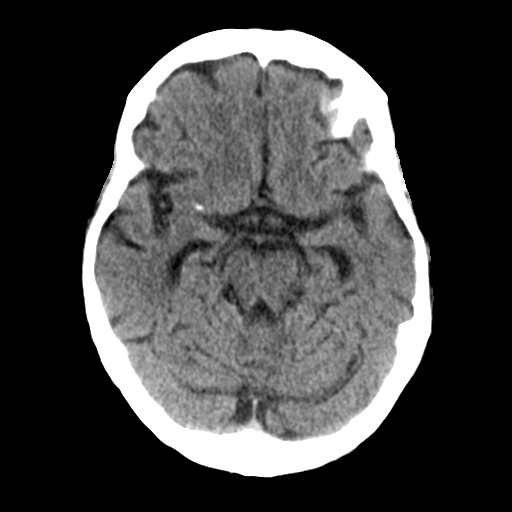
[im 11/30  brain]
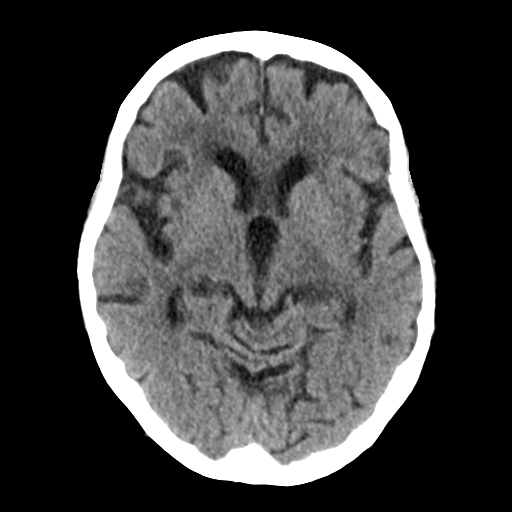
[im 14/30  brain]
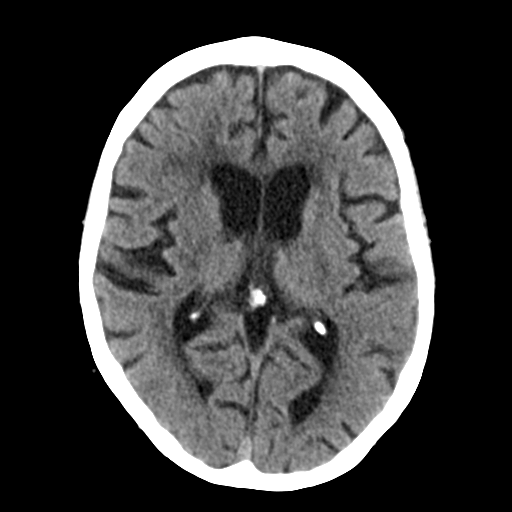
[im 14/30  bone]
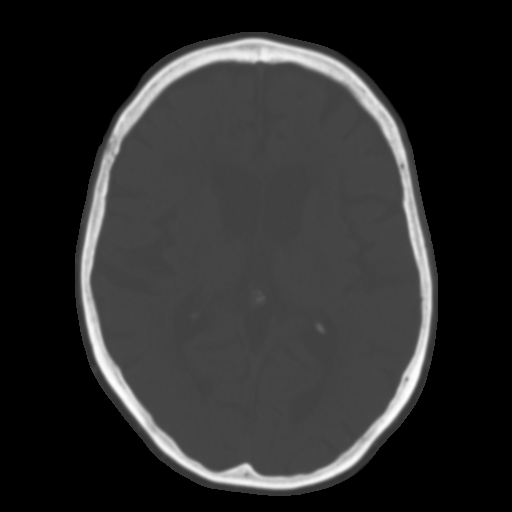
[im 17/30  brain]
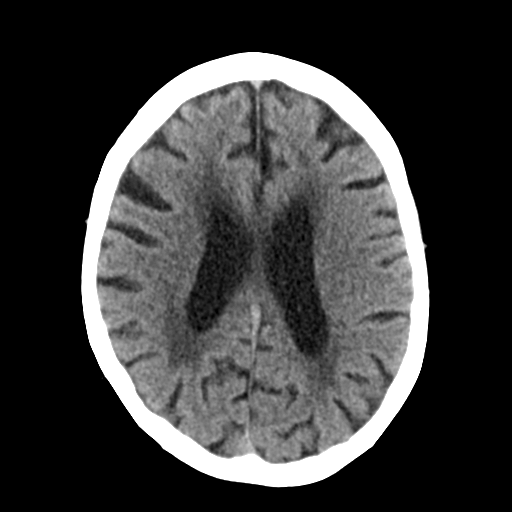
[im 20/30  brain]
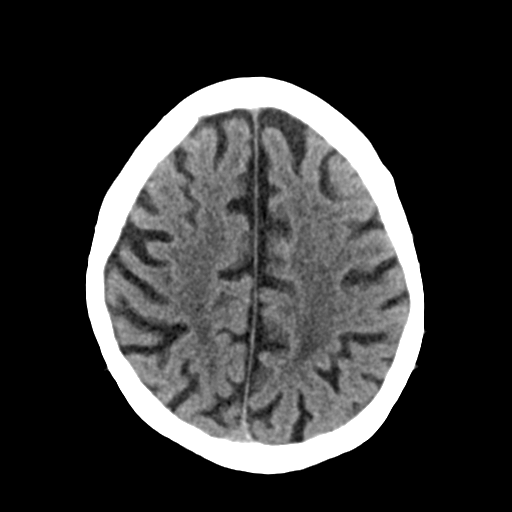
[im 23/30  brain]
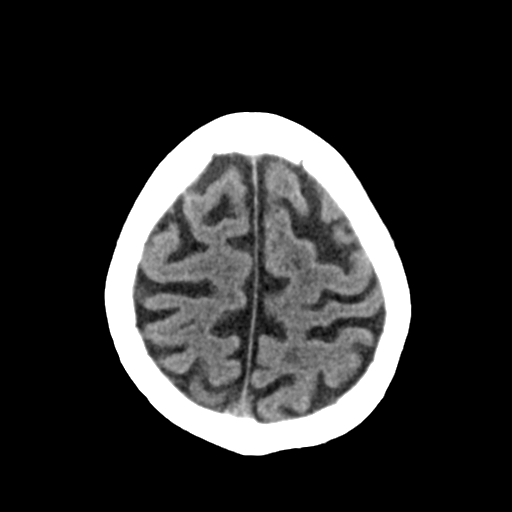
[im 25/30  brain]
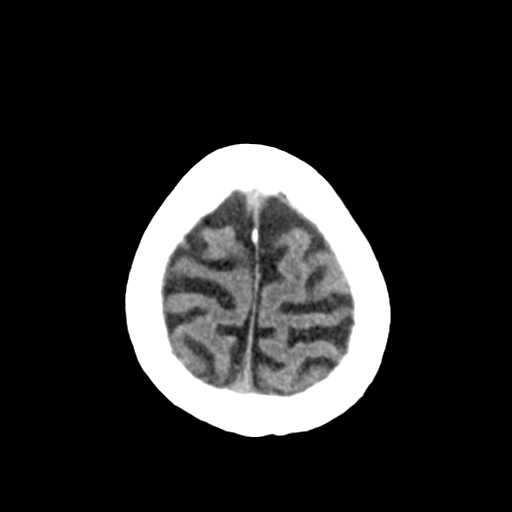
[im 25/30  bone]
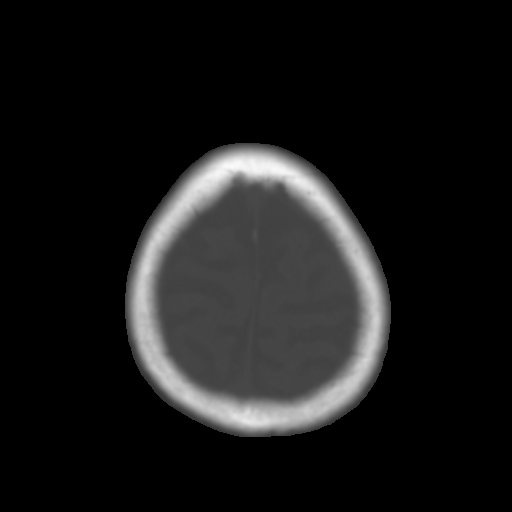
[im 28/30  brain]
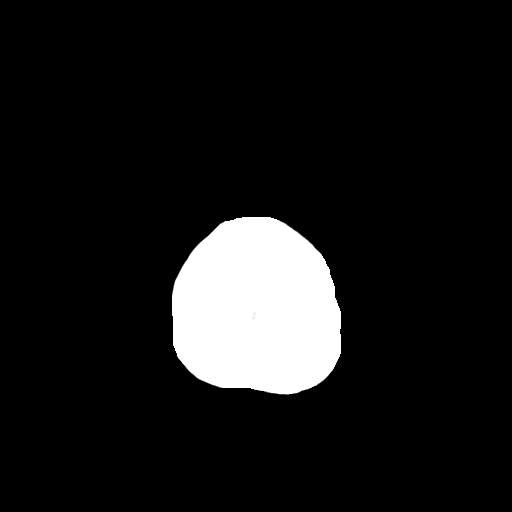

[Series 4: coronal soft tissue · coronal · 0.31mm/px · 3 of 62 slices shown]
[im 21/62  brain]
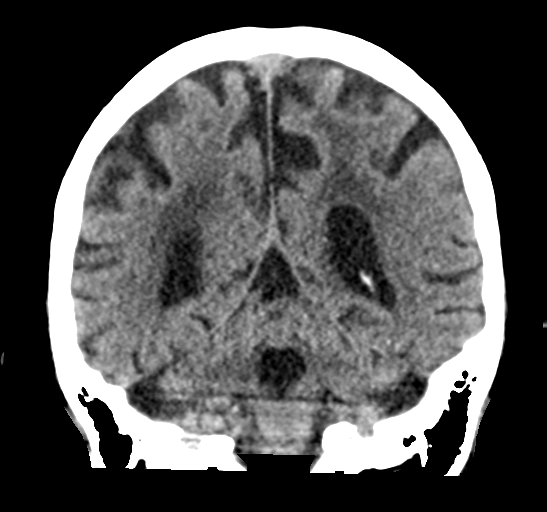
[im 28/62  brain]
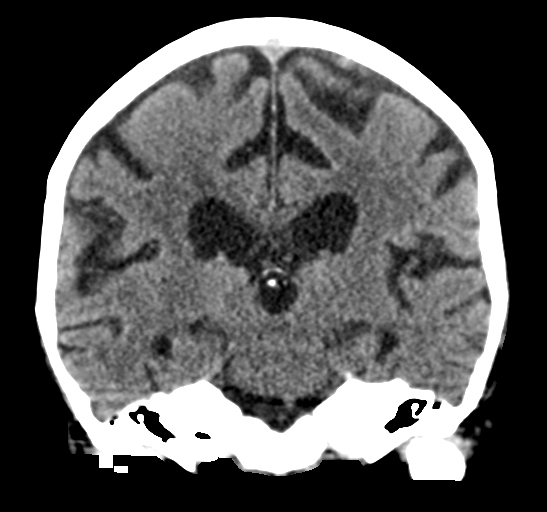
[im 34/62  brain]
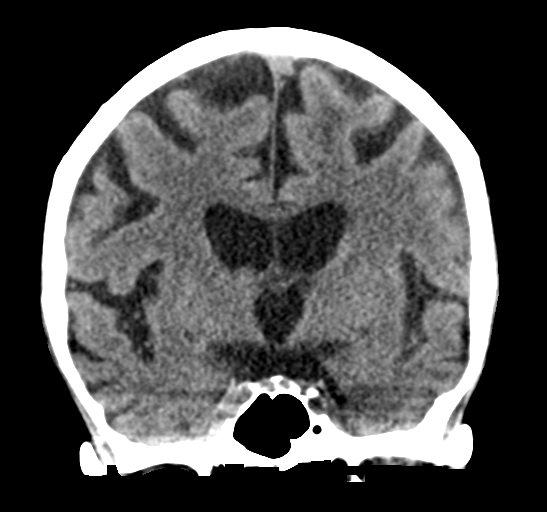

[Series 5: sagittal soft tissue · sagittal · 0.31mm/px · 3 of 49 slices shown]
[im 17/49  brain]
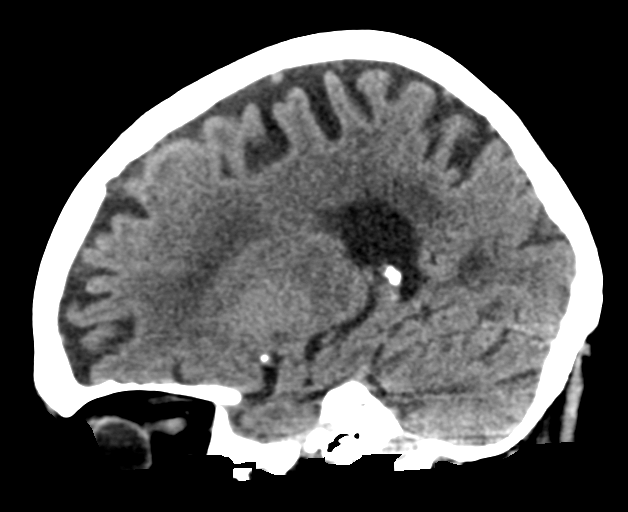
[im 25/49  brain]
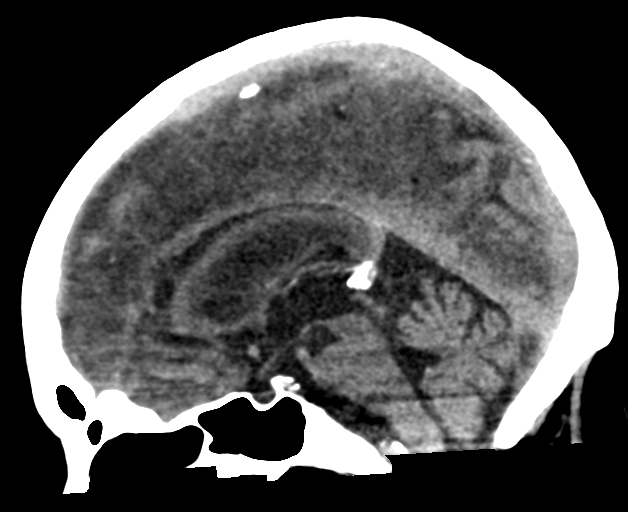
[im 33/49  brain]
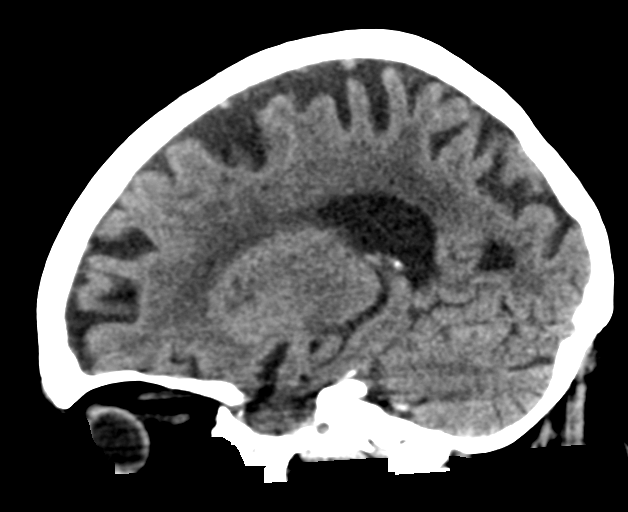

[16 of 47 positions shown; findings below may reference images not displayed]

FINDINGS: Brain: No acute intracranial hemorrhage. No focal mass lesion. No CT
evidence of acute infarction. No midline shift or mass effect. No
hydrocephalus. Basilar cisterns are patent.

There are periventricular and subcortical white matter
hypodensities. Generalized cortical atrophy.

Vascular: No hyperdense vessel or unexpected calcification.

Skull: Normal. Negative for fracture or focal lesion.

Sinuses/Orbits: Paranasal sinuses and mastoid air cells are clear.
Orbits are clear.

Other: None.
IMPRESSION: 1. No acute intracranial findings.
2. Atrophy and white matter microvascular disease.
